# Patient Record
Sex: Male | Born: 1935 | Race: Black or African American | Hispanic: No | Marital: Married
Health system: Southern US, Community
[De-identification: ages and names within clinical notes are randomized; demographics above are authoritative.]

---

## 2021-09-21 ENCOUNTER — Inpatient Hospital Stay
Admission: AD | Admit: 2021-09-21 | Discharge: 2021-11-09 | Disposition: A | Discharge status: Skilled Nursing Facility | Payer: Medicare Other | Source: Other Acute Inpatient Hospital | Attending: Internal Medicine | Admitting: Internal Medicine

## 2021-09-21 ENCOUNTER — Other Ambulatory Visit (HOSPITAL_COMMUNITY): Payer: Medicare Other

## 2021-09-21 DIAGNOSIS — I480 Paroxysmal atrial fibrillation: Secondary | ICD-10-CM

## 2021-09-21 DIAGNOSIS — R140 Abdominal distension (gaseous): Secondary | ICD-10-CM

## 2021-09-21 DIAGNOSIS — R6520 Severe sepsis without septic shock: Secondary | ICD-10-CM

## 2021-09-21 DIAGNOSIS — G9341 Metabolic encephalopathy: Secondary | ICD-10-CM

## 2021-09-21 DIAGNOSIS — S72002A Fracture of unspecified part of neck of left femur, initial encounter for closed fracture: Secondary | ICD-10-CM

## 2021-09-21 DIAGNOSIS — Z9359 Other cystostomy status: Secondary | ICD-10-CM

## 2021-09-21 DIAGNOSIS — J95811 Postprocedural pneumothorax: Secondary | ICD-10-CM

## 2021-09-21 DIAGNOSIS — Z9689 Presence of other specified functional implants: Secondary | ICD-10-CM

## 2021-09-21 DIAGNOSIS — J811 Chronic pulmonary edema: Secondary | ICD-10-CM

## 2021-09-21 DIAGNOSIS — K567 Ileus, unspecified: Secondary | ICD-10-CM

## 2021-09-21 DIAGNOSIS — J90 Pleural effusion, not elsewhere classified: Secondary | ICD-10-CM

## 2021-09-21 DIAGNOSIS — Z9889 Other specified postprocedural states: Secondary | ICD-10-CM

## 2021-09-21 DIAGNOSIS — A419 Sepsis, unspecified organism: Secondary | ICD-10-CM

## 2021-09-21 DIAGNOSIS — J189 Pneumonia, unspecified organism: Secondary | ICD-10-CM

## 2021-09-21 DIAGNOSIS — Z938 Other artificial opening status: Secondary | ICD-10-CM

## 2021-09-21 DIAGNOSIS — J9621 Acute and chronic respiratory failure with hypoxia: Secondary | ICD-10-CM

## 2021-09-21 DIAGNOSIS — Z431 Encounter for attention to gastrostomy: Secondary | ICD-10-CM

## 2021-09-21 DIAGNOSIS — J9690 Respiratory failure, unspecified, unspecified whether with hypoxia or hypercapnia: Secondary | ICD-10-CM

## 2021-09-21 DIAGNOSIS — Z931 Gastrostomy status: Secondary | ICD-10-CM

## 2021-09-21 DIAGNOSIS — Z9911 Dependence on respirator [ventilator] status: Secondary | ICD-10-CM

## 2021-09-21 DIAGNOSIS — J939 Pneumothorax, unspecified: Secondary | ICD-10-CM

## 2021-09-21 LAB — BLOOD GAS, ARTERIAL
Acid-base deficit: 1.1 mmol/L (ref 0.0–2.0)
Bicarbonate: 22 mmol/L (ref 20.0–28.0)
FIO2: 28
O2 Saturation: 95.6 %
Patient temperature: 37
pCO2 arterial: 30 mmHg — ABNORMAL LOW (ref 32.0–48.0)
pH, Arterial: 7.478 — ABNORMAL HIGH (ref 7.350–7.450)
pO2, Arterial: 73.5 mmHg — ABNORMAL LOW (ref 83.0–108.0)

## 2021-09-21 MED ORDER — DIATRIZOATE MEGLUMINE & SODIUM 66-10 % PO SOLN: 300.0000 mL | Freq: Once | ORAL | Status: DC

## 2021-09-21 MED ORDER — DIATRIZOATE MEGLUMINE & SODIUM 66-10 % PO SOLN
ORAL | Status: AC
  Filled 2021-09-21: qty 30

## 2021-09-21 NOTE — Consult Note (Signed)
Referring Physician: S. Manson Passey, MD  Stephen Cobb is an 87 y.o. male.                       Chief Complaint: atrial fibrillation with RVR  HPI: 87 years old white male with PMH of paroxysmal atrial fibrillation, HTN, Septic shock, metabolic encephalopathy, C. Difficile colitis, CKD, IV, Previous CVA, Chronic systolic heart failure, Sacral area decubitus, Bilateral BKA stump ulcers and type 2 DM had episode of atrial fibrillation with RVR self terminating into sinus rhythm. Limited echocardiogram last month showed EF 25-30 % with mild Mitral stenosis.  Past medical history as per HPI.  PSH: Angioplasty-1993 and TURP - 12/2007.  The histories are not reviewed yet. Please review them in the "History" navigator section and refresh this SmartLink.  No family history on file. Social History:  has no history on file for tobacco use, alcohol use, and drug use.  Allergies: No known allergies.  No medications prior to admission.  Amiodarone, Coreg, Eliquis, Januvia, Famotidine, Tylenol, etc.   Results for orders placed or performed during the hospital encounter of 09/21/21 (from the past 48 hour(s))  Blood gas, arterial     Status: Abnormal   Collection Time: 09/21/21  4:44 PM  Result Value Ref Range   FIO2 28.00    pH, Arterial 7.478 (H) 7.350 - 7.450   pCO2 arterial 30.0 (L) 32.0 - 48.0 mmHg   pO2, Arterial 73.5 (L) 83.0 - 108.0 mmHg   Bicarbonate 22.0 20.0 - 28.0 mmol/L   Acid-base deficit 1.1 0.0 - 2.0 mmol/L   O2 Saturation 95.6 %   Patient temperature 37.0    Collection site RIGHT BRACHIAL    Drawn by FB POWERS,RRT    Sample type ARTHROGRAPHIS SPECIES     Comment: Performed at [facility], [address], [place], [place] [address]   No results found.  Review Of Systems As per HPI.   P: 92, Sinus rhythm, R: 30, BP: 156/90, O2 sat 100 % on 28 % FiO2 by T collar There is no height or weight on file to calculate BMI. General appearance: alert, cooperative, appears stated  age and in moderate respiratory distress Head: Normocephalic, atraumatic. Eyes: Blue eyes, pink conjunctiva, corneas clear. PERRL, EOM's intact. Neck: No adenopathy, no carotid bruit, no JVD, supple, symmetrical, tracheostomy. Resp: Clearing to auscultation bilaterally. Cardio: Regular rate and rhythm, S1, S2 normal, II/VI systolic murmur, no click, rub or gallop GI: Soft, non-tender; bowel sounds normal; no organomegaly. Extremities: No edema, cyanosis or clubbing. Bil. BKA with dressing on stumps. Skin: Warm and dry.  Neurologic: Alert and oriented X 0. Contracture of both upper extremities but has some motion of right upper extremity.  Assessment/Plan Atrial fibrillation with RVR, paroxysmal, [id number] score of 7 Acute on chronic respiratory failure with hypoxia S/P tracheostomy HTN Type 2 DM Bilateral BKA S/P CVA S/P septic shock S/P encephalopathy  Plan: Continue amiodarone and Eliquis. Increase coreg to 12.5 mg. Bid. Add small dose diltiazem as tolerated for rate control. Agree with prn metoprolol.   Time spent: Review of old records, Lab, x-rays, EKG, other cardiac tests, examination, discussion with patient/Nurse/Doctor over 70 minutes.  Ricki Rodriguez, MD  09/21/2021, 5:21 PM

## 2021-09-22 DIAGNOSIS — I480 Paroxysmal atrial fibrillation: Secondary | ICD-10-CM

## 2021-09-22 DIAGNOSIS — J9621 Acute and chronic respiratory failure with hypoxia: Secondary | ICD-10-CM

## 2021-09-22 DIAGNOSIS — G9341 Metabolic encephalopathy: Secondary | ICD-10-CM

## 2021-09-22 DIAGNOSIS — A419 Sepsis, unspecified organism: Secondary | ICD-10-CM

## 2021-09-22 DIAGNOSIS — J189 Pneumonia, unspecified organism: Secondary | ICD-10-CM | POA: Diagnosis not present

## 2021-09-22 DIAGNOSIS — R6520 Severe sepsis without septic shock: Secondary | ICD-10-CM

## 2021-09-22 LAB — COMPREHENSIVE METABOLIC PANEL
ALT: 16 U/L (ref 0–44)
AST: 20 U/L (ref 15–41)
Albumin: 1.7 g/dL — ABNORMAL LOW (ref 3.5–5.0)
Alkaline Phosphatase: 60 U/L (ref 38–126)
Anion gap: 8 (ref 5–15)
BUN: 66 mg/dL — ABNORMAL HIGH (ref 8–23)
CO2: 22 mmol/L (ref 22–32)
Calcium: 8.2 mg/dL — ABNORMAL LOW (ref 8.9–10.3)
Chloride: 114 mmol/L — ABNORMAL HIGH (ref 98–111)
Creatinine, Ser: 1.92 mg/dL — ABNORMAL HIGH (ref 0.61–1.24)
GFR, Estimated: 33 mL/min — ABNORMAL LOW (ref >60)
Glucose, Bld: 142 mg/dL — ABNORMAL HIGH (ref 70–99)
Potassium: 3.6 mmol/L (ref 3.5–5.1)
Sodium: 144 mmol/L (ref 135–145)
Total Bilirubin: 0.6 mg/dL (ref 0.3–1.2)
Total Protein: 5.8 g/dL — ABNORMAL LOW (ref 6.5–8.1)

## 2021-09-22 LAB — CBC WITH DIFFERENTIAL/PLATELET
Abs Immature Granulocytes: 0.04 10*3/uL (ref 0.00–0.07)
Basophils Absolute: 0 10*3/uL (ref 0.0–0.1)
Basophils Relative: 0 %
Eosinophils Absolute: 0.3 10*3/uL (ref 0.0–0.5)
Eosinophils Relative: 4 %
HCT: 27.3 % — ABNORMAL LOW (ref 39.0–52.0)
Hemoglobin: 8.3 g/dL — ABNORMAL LOW (ref 13.0–17.0)
Immature Granulocytes: 0 %
Lymphocytes Relative: 14 %
Lymphs Abs: 1.3 10*3/uL (ref 0.7–4.0)
MCH: 27.9 pg (ref 26.0–34.0)
MCHC: 30.4 g/dL (ref 30.0–36.0)
MCV: 91.6 fL (ref 80.0–100.0)
Monocytes Absolute: 0.6 10*3/uL (ref 0.1–1.0)
Monocytes Relative: 6 %
Neutro Abs: 6.8 10*3/uL (ref 1.7–7.7)
Neutrophils Relative %: 76 %
Platelets: 155 10*3/uL (ref 150–400)
RBC: 2.98 MIL/uL — ABNORMAL LOW (ref 4.22–5.81)
RDW: 19 % — ABNORMAL HIGH (ref 11.5–15.5)
WBC: 9 10*3/uL (ref 4.0–10.5)
nRBC: 0.2 % (ref 0.0–0.2)

## 2021-09-22 LAB — URINALYSIS, ROUTINE W REFLEX MICROSCOPIC
Bilirubin Urine: NEGATIVE
Glucose, UA: NEGATIVE mg/dL
Ketones, ur: NEGATIVE mg/dL
Nitrite: POSITIVE — AB
Protein, ur: 100 mg/dL — AB
RBC / HPF: >50 RBC/hpf — ABNORMAL HIGH (ref 0–5)
Specific Gravity, Urine: 1.016 (ref 1.005–1.030)
WBC, UA: >50 WBC/hpf — ABNORMAL HIGH (ref 0–5)
pH: 5 (ref 5.0–8.0)

## 2021-09-22 LAB — PHOSPHORUS: Phosphorus: 3.4 mg/dL (ref 2.5–4.6)

## 2021-09-22 LAB — TSH: TSH: 3.133 u[IU]/mL (ref 0.350–4.500)

## 2021-09-22 LAB — MAGNESIUM: Magnesium: 1.7 mg/dL (ref 1.7–2.4)

## 2021-09-22 LAB — PROTIME-INR
INR: 1.4 — ABNORMAL HIGH (ref 0.8–1.2)
Prothrombin Time: 16.8 seconds — ABNORMAL HIGH (ref 11.4–15.2)

## 2021-09-22 LAB — HEMOGLOBIN A1C
Hgb A1c MFr Bld: 6.9 % — ABNORMAL HIGH (ref 4.8–5.6)
Mean Plasma Glucose: 151.33 mg/dL

## 2021-09-22 LAB — BRAIN NATRIURETIC PEPTIDE: B Natriuretic Peptide: 1446.6 pg/mL — ABNORMAL HIGH (ref 0.0–100.0)

## 2021-09-22 NOTE — Consult Note (Signed)
Pulmonary Critical Care Medicine [facility] [place]  PULMONARY SERVICE  Date of Service: 09/22/2021  PULMONARY CRITICAL CARE CONSULT   Stephen Cobb  NWG:[id number]  DOB: December 21, 1930   DOA: 09/21/2021  Referring Physician: Luna Kitchens, MD  HPI: Stephen Cobb is a 87 y.o. male seen for follow up of Acute on Chronic Respiratory Failure.  Patient has multiple medical problems including stroke atrial fibrillation hypertension type 2 diabetes chronic systolic heart failure stage IV kidney disease came into the hospital with sepsis was found to have urinary tract infection along with decubitus ulcers and C. difficile infection.  Patient had a long protracted course with respiratory failure and ended up intubated on mechanical ventilation subsequently was not able to come off of mechanical ventilation and had to have tracheostomy done.  Patient now is on T collar and is on 28% FiO2  Review of Systems:  ROS performed and is unremarkable other than noted above.  Past Medical History Previous CVA Paroxysmal atrial fibrillation Hypertension Chronic systolic CHF Type 2 diabetes mellitus Stage IV chronic kidney disease PVD/bilateral BKA's  Surgical History He has a past surgical history that includes Angioplasty (1993) and Transurethral resection of prostate (12/2007).   Past family history: Noncontributory to the present illness other than hypertension and leukemia  Allergies: No known drug allergies  Social history former smoker no alcohol or drug abuse  Medications: Reviewed on Rounds  Physical Exam:  Vitals: Temperature is 98.0 pulse 89 respiratory 25 blood pressure is 146/56 saturations 100%  Ventilator Settings off the ventilator on T collar  General: Comfortable at this time Eyes: Grossly normal lids, irises & conjunctiva ENT: grossly tongue is normal Neck: no obvious mass Cardiovascular: S1-S2 normal no gallop or rub Respiratory: Scattered rhonchi  expansion is equal Abdomen: Soft and nontender Skin: no rash seen on limited exam Musculoskeletal: not rigid Psychiatric:unable to assess Neurologic: no seizure no involuntary movements         Labs on Admission:  Basic Metabolic Panel: Recent Labs  Lab 09/22/21 0427  NA 144  K 3.6  CL 114*  CO2 22  GLUCOSE 142*  BUN 66*  CREATININE 1.92*  CALCIUM 8.2*  MG 1.7  PHOS 3.4    Recent Labs  Lab 09/21/21 1644  PHART 7.478*  PCO2ART 30.0*  PO2ART 73.5*  HCO3 22.0  O2SAT 95.6    Liver Function Tests: Recent Labs  Lab 09/22/21 0427  AST 20  ALT 16  ALKPHOS 60  BILITOT 0.6  PROT 5.8*  ALBUMIN 1.7*   No results for input(s): LIPASE, AMYLASE in the last 168 hours. No results for input(s): AMMONIA in the last 168 hours.  CBC: Recent Labs  Lab 09/22/21 0427  WBC 9.0  NEUTROABS 6.8  HGB 8.3*  HCT 27.3*  MCV 91.6  PLT 155    Cardiac Enzymes: No results for input(s): CKTOTAL, CKMB, CKMBINDEX, TROPONINI in the last 168 hours.  BNP (last 3 results) Recent Labs    09/22/21 0427  BNP 1,446.6*    ProBNP (last 3 results) No results for input(s): PROBNP in the last 8760 hours.   Radiological Exams on Admission: DG ABDOMEN PEG TUBE LOCATION  Result Date: 09/21/2021 CLINICAL DATA:  Peg adjustment/replacement/ EXAM: ABDOMEN - 1 VIEW COMPARISON:  None. FINDINGS: Peg tube projects over the stomach. Contrast is seen within the stomach and proximal small bowel. No contrast extravasation. No evidence of bowel obstruction. The pigtail drainage catheter projects over the lower pelvis. Chronic appearing left femoral neck fracture  varus angulation. IMPRESSION: Peg tube within the stomach.  No contrast extravasation. No bowel obstruction. Chronic appearing left femoral neck fracture. Electronically Signed   By: Charlett Nose M.D.   On: 09/21/2021 18:22   DG CHEST Cobb 1 VIEW  Result Date: 09/21/2021 CLINICAL DATA:  Ventilator dependent, respiratory failure, pneumonia  EXAM: PORTABLE CHEST 1 VIEW COMPARISON:  None. FINDINGS: Tracheostomy projects over the mid trachea. Right PICC line tip is at the cavoatrial junction. Diffuse airspace disease throughout the left lung. No confluent opacity on the right. Mild vascular congestion. Suspect small bilateral effusions. No acute bony abnormality. IMPRESSION: Diffuse airspace disease throughout the left lung concerning for pneumonia. Mild vascular congestion. Suspect small effusions. Electronically Signed   By: Charlett Nose M.D.   On: 09/21/2021 18:23    Assessment/Plan Active Problems:   Acute on chronic respiratory failure with hypoxia (HCC)   AF (paroxysmal atrial fibrillation) (HCC)   Severe sepsis (HCC)   Acute metabolic encephalopathy   Healthcare-associated pneumonia   Acute on chronic respiratory failure with hypoxia patient is now off the ventilator on T collar.  Plan is going to be to change the trach out downsized to a #6 cuffless trach today and then assess for PMV.  Secretions might be the limiting factor Paroxysmal atrial fibrillation rate is controlled at this time we will continue with supportive care Severe sepsis treated resolved hemodynamics Reitnauer stable we will continue to monitor closely. Acute metabolic encephalopathy slow to improve we will continue with supportive care Healthcare associated pneumonia patient's chest x-ray does have diffuse airspace disease along with some pulmonary vascular congestion.  Cardiology has already seen the patient will follow-up with x-rays  I have personally seen and evaluated the patient, evaluated laboratory and imaging results, formulated the assessment and plan and placed orders. The Patient requires high complexity decision making with multiple systems involvement.  Case was discussed on Rounds with the Respiratory Therapy Director and the Respiratory staff Time Spent  Yevonne Pax, MD [facility] Pulmonary Critical Care Medicine Sleep Medicine

## 2021-09-22 NOTE — Consult Note (Signed)
Ref: Default, Provider, MD   Subjective:  Awake. He is in sinus rhythm.   Objective:  Vital Signs in the last 24 hours:  P: 85, R: 29, BP: 150/80, O2 sat: 100 %, FiO2 28 %  Physical Exam: BP Readings from Last 1 Encounters:  No data found for BP     Wt Readings from Last 1 Encounters:  No data found for Wt    Weight change:  There is no height or weight on file to calculate BMI. HEENT: [place]/AT, Eyes-Blue, Conjunctiva-Pale, Sclera-Non-icteric Neck: No JVD, No bruit, Tracheostomy in place. Lungs:  Clearing, Bilateral. Cardiac:  Regular rhythm, normal S1 and S2, no S3. II/VI systolic murmur. Abdomen:  Soft, non-tender. BS present. Extremities:  No edema present. No cyanosis. No clubbing. Bilateral BKA. CNS: AxOx3, Cranial nerves grossly intact. Contracture of both upper extremities. Skin: Warm and dry.   Intake/Output from previous day: No intake/output data recorded.    Lab Results: BMET    Component Value Date/Time   NA 144 09/22/2021 0427   K 3.6 09/22/2021 0427   CL 114 (H) 09/22/2021 0427   CO2 22 09/22/2021 0427   GLUCOSE 142 (H) 09/22/2021 0427   BUN 66 (H) 09/22/2021 0427   CREATININE 1.92 (H) 09/22/2021 0427   CALCIUM 8.2 (L) 09/22/2021 0427   GFRNONAA 33 (L) 09/22/2021 0427   CBC    Component Value Date/Time   WBC 9.0 09/22/2021 0427   RBC 2.98 (L) 09/22/2021 0427   HGB 8.3 (L) 09/22/2021 0427   HCT 27.3 (L) 09/22/2021 0427   PLT 155 09/22/2021 0427   MCV 91.6 09/22/2021 0427   MCH 27.9 09/22/2021 0427   MCHC 30.4 09/22/2021 0427   RDW 19.0 (H) 09/22/2021 0427   LYMPHSABS 1.3 09/22/2021 0427   MONOABS 0.6 09/22/2021 0427   EOSABS 0.3 09/22/2021 0427   BASOSABS 0.0 09/22/2021 0427   HEPATIC Function Panel Recent Labs    09/22/21 0427  PROT 5.8*   HEMOGLOBIN A1C No components found for: HGA1C,  MPG CARDIAC ENZYMES No results found for: CKTOTAL, CKMB, CKMBINDEX, TROPONINI BNP No results for input(s): PROBNP in the last 8760  hours. TSH Recent Labs    09/22/21 0427  TSH 3.133   CHOLESTEROL No results for input(s): CHOL in the last 8760 hours.  Scheduled Meds:  diatrizoate meglumine-sodium  300 mL Per Tube Once   Continuous Infusions:  PRN Meds:.  Assessment/Plan:  Paroxysmal atrial fibrillation Acute on chronic respiratory failure with hypoxia S/P tracheostomy HTN Type 2 DM Bilateral BKA S/P CVA PVD S/P septic shock S/P encephalopathy  Plan: Continue medical treatment   LOS: 0 days   Time spent including chart review, lab review, examination, discussion with patient/Nurse : 30 min   Orpah Cobb  MD  09/22/2021, 12:32 PM

## 2021-09-23 DIAGNOSIS — J9621 Acute and chronic respiratory failure with hypoxia: Secondary | ICD-10-CM | POA: Diagnosis not present

## 2021-09-23 DIAGNOSIS — J189 Pneumonia, unspecified organism: Secondary | ICD-10-CM | POA: Diagnosis not present

## 2021-09-23 DIAGNOSIS — G9341 Metabolic encephalopathy: Secondary | ICD-10-CM | POA: Diagnosis not present

## 2021-09-23 DIAGNOSIS — I480 Paroxysmal atrial fibrillation: Secondary | ICD-10-CM | POA: Diagnosis not present

## 2021-09-23 LAB — RENAL FUNCTION PANEL
Albumin: 1.8 g/dL — ABNORMAL LOW (ref 3.5–5.0)
Anion gap: 10 (ref 5–15)
BUN: 70 mg/dL — ABNORMAL HIGH (ref 8–23)
CO2: 21 mmol/L — ABNORMAL LOW (ref 22–32)
Calcium: 8.2 mg/dL — ABNORMAL LOW (ref 8.9–10.3)
Chloride: 112 mmol/L — ABNORMAL HIGH (ref 98–111)
Creatinine, Ser: 1.94 mg/dL — ABNORMAL HIGH (ref 0.61–1.24)
GFR, Estimated: 33 mL/min — ABNORMAL LOW (ref >60)
Glucose, Bld: 157 mg/dL — ABNORMAL HIGH (ref 70–99)
Phosphorus: 3.5 mg/dL (ref 2.5–4.6)
Potassium: 3.8 mmol/L (ref 3.5–5.1)
Sodium: 143 mmol/L (ref 135–145)

## 2021-09-23 LAB — CBC
HCT: 24.8 % — ABNORMAL LOW (ref 39.0–52.0)
Hemoglobin: 7.6 g/dL — ABNORMAL LOW (ref 13.0–17.0)
MCH: 27.7 pg (ref 26.0–34.0)
MCHC: 30.6 g/dL (ref 30.0–36.0)
MCV: 90.5 fL (ref 80.0–100.0)
Platelets: 174 10*3/uL (ref 150–400)
RBC: 2.74 MIL/uL — ABNORMAL LOW (ref 4.22–5.81)
RDW: 18.8 % — ABNORMAL HIGH (ref 11.5–15.5)
WBC: 10.4 10*3/uL (ref 4.0–10.5)
nRBC: 0 % (ref 0.0–0.2)

## 2021-09-23 LAB — URINE CULTURE: Culture: >=100000 — AB

## 2021-09-23 LAB — MAGNESIUM: Magnesium: 1.8 mg/dL (ref 1.7–2.4)

## 2021-09-23 NOTE — Progress Notes (Signed)
Pulmonary Critical Care Medicine [facility] [place]   PULMONARY CRITICAL CARE SERVICE  PROGRESS NOTE     LUQMAN PERRELLI  ALP:[id number]  DOB: [birth year]   DOA: 09/21/2021  Referring Physician: Luna Kitchens, MD  HPI: DREWEY BEGUE is a 87 y.o. male being followed for ventilator/airway/oxygen weaning Acute on Chronic Respiratory Failure.  Patient is on T collar weaning on 28% FiO2 should be able to try PMV today  Medications: Reviewed on Rounds  Physical Exam:  Vitals: Temperature is 97.8 pulse 78 respiratory 24 blood pressure is 120/100 saturations 98%  Ventilator Settings currently off the ventilator on T collar  General: Comfortable at this time Neck: supple Cardiovascular: no malignant arrhythmias Respiratory: No rhonchi very coarse breath sounds Skin: no rash seen on limited exam Musculoskeletal: No gross abnormality Psychiatric:unable to assess Neurologic:no involuntary movements         Lab Data:   Basic Metabolic Panel: Recent Labs  Lab 09/22/21 0427 09/23/21 0358  NA 144 143  K 3.6 3.8  CL 114* 112*  CO2 22 21*  GLUCOSE 142* 157*  BUN 66* 70*  CREATININE 1.92* 1.94*  CALCIUM 8.2* 8.2*  MG 1.7 1.8  PHOS 3.4 3.5    ABG: Recent Labs  Lab 09/21/21 1644  PHART 7.478*  PCO2ART 30.0*  PO2ART 73.5*  HCO3 22.0  O2SAT 95.6    Liver Function Tests: Recent Labs  Lab 09/22/21 0427 09/23/21 0358  AST 20  --   ALT 16  --   ALKPHOS 60  --   BILITOT 0.6  --   PROT 5.8*  --   ALBUMIN 1.7* 1.8*   No results for input(s): LIPASE, AMYLASE in the last 168 hours. No results for input(s): AMMONIA in the last 168 hours.  CBC: Recent Labs  Lab 09/22/21 0427 09/23/21 0358  WBC 9.0 10.4  NEUTROABS 6.8  --   HGB 8.3* 7.6*  HCT 27.3* 24.8*  MCV 91.6 90.5  PLT 155 174    Cardiac Enzymes: No results for input(s): CKTOTAL, CKMB, CKMBINDEX, TROPONINI in the last 168 hours.  BNP (last 3 results) Recent Labs    09/22/21 0427   BNP 1,446.6*    ProBNP (last 3 results) No results for input(s): PROBNP in the last 8760 hours.  Radiological Exams: DG ABDOMEN PEG TUBE LOCATION  Result Date: 09/21/2021 CLINICAL DATA:  Peg adjustment/replacement/ EXAM: ABDOMEN - 1 VIEW COMPARISON:  None. FINDINGS: Peg tube projects over the stomach. Contrast is seen within the stomach and proximal small bowel. No contrast extravasation. No evidence of bowel obstruction. The pigtail drainage catheter projects over the lower pelvis. Chronic appearing left femoral neck fracture varus angulation. IMPRESSION: Peg tube within the stomach.  No contrast extravasation. No bowel obstruction. Chronic appearing left femoral neck fracture. Electronically Signed   By: Charlett Nose M.D.   On: 09/21/2021 18:22   DG CHEST PORT 1 VIEW  Result Date: 09/21/2021 CLINICAL DATA:  Ventilator dependent, respiratory failure, pneumonia EXAM: PORTABLE CHEST 1 VIEW COMPARISON:  None. FINDINGS: Tracheostomy projects over the mid trachea. Right PICC line tip is at the cavoatrial junction. Diffuse airspace disease throughout the left lung. No confluent opacity on the right. Mild vascular congestion. Suspect small bilateral effusions. No acute bony abnormality. IMPRESSION: Diffuse airspace disease throughout the left lung concerning for pneumonia. Mild vascular congestion. Suspect small effusions. Electronically Signed   By: Charlett Nose M.D.   On: 09/21/2021 18:23    Assessment/Plan Active Problems:   Acute on chronic  respiratory failure with hypoxia (HCC)   AF (paroxysmal atrial fibrillation) (HCC)   Severe sepsis (HCC)   Acute metabolic encephalopathy   Healthcare-associated pneumonia   Acute on chronic respiratory failure with hypoxia we will continue with T collar patient is on 28% FiO2 use PMV as ordered Atrial fibrillation rate is controlled Severe sepsis treated resolved Metabolic encephalopathy slow to improve Of care associated pneumonia treated   I  have personally seen and evaluated the patient, evaluated laboratory and imaging results, formulated the assessment and plan and placed orders. The Patient requires high complexity decision making with multiple systems involvement.  Rounds were done with the Respiratory Therapy Director and Staff therapists and discussed with nursing staff also.  Yevonne Pax, MD [facility] Pulmonary Critical Care Medicine Sleep Medicine

## 2021-09-23 NOTE — Consult Note (Addendum)
Ref: Shayne Alken, MD   Subjective:  Awake. Monitor shows sinus rhythm.  Objective:  Vital Signs in the last 24 hours:  P: 71, R: 18, BP: 120/90, O2 sat 100 % on T collar  Physical Exam: BP Readings from Last 1 Encounters:  No data found for BP     Wt Readings from Last 1 Encounters:  No data found for Wt    Weight change:  There is no height or weight on file to calculate BMI. HEENT: [place]/AT, Eyes-Blue, Conjunctiva-Pale, Sclera-Non-icteric Neck: No JVD, No bruit, Tracheostomy in place. Lungs:  Clearing, Bilateral. Cardiac:  Regular rhythm, normal S1 and S2, no S3. II/VI systolic murmur. Abdomen:  Soft, non-tender. BS present. Extremities:  No edema present. No cyanosis. No clubbing. Bil. BKA. CNS: AxOx1., Cranial nerves grossly intact. Bilateral upper extremities contractures.  Skin: Warm and dry.   Intake/Output from previous day: No intake/output data recorded.    Lab Results: BMET    Component Value Date/Time   NA 143 09/23/2021 0358   NA 144 09/22/2021 0427   K 3.8 09/23/2021 0358   K 3.6 09/22/2021 0427   CL 112 (H) 09/23/2021 0358   CL 114 (H) 09/22/2021 0427   CO2 21 (L) 09/23/2021 0358   CO2 22 09/22/2021 0427   GLUCOSE 157 (H) 09/23/2021 0358   GLUCOSE 142 (H) 09/22/2021 0427   BUN 70 (H) 09/23/2021 0358   BUN 66 (H) 09/22/2021 0427   CREATININE 1.94 (H) 09/23/2021 0358   CREATININE 1.92 (H) 09/22/2021 0427   CALCIUM 8.2 (L) 09/23/2021 0358   CALCIUM 8.2 (L) 09/22/2021 0427   GFRNONAA 33 (L) 09/23/2021 0358   GFRNONAA 33 (L) 09/22/2021 0427   CBC    Component Value Date/Time   WBC 10.4 09/23/2021 0358   RBC 2.74 (L) 09/23/2021 0358   HGB 7.6 (L) 09/23/2021 0358   HCT 24.8 (L) 09/23/2021 0358   PLT 174 09/23/2021 0358   MCV 90.5 09/23/2021 0358   MCH 27.7 09/23/2021 0358   MCHC 30.6 09/23/2021 0358   RDW 18.8 (H) 09/23/2021 0358   LYMPHSABS 1.3 09/22/2021 0427   MONOABS 0.6 09/22/2021 0427   EOSABS 0.3 09/22/2021 0427   BASOSABS  0.0 09/22/2021 0427   HEPATIC Function Panel Recent Labs    09/22/21 0427  PROT 5.8*   HEMOGLOBIN A1C No components found for: HGA1C,  MPG CARDIAC ENZYMES No results found for: CKTOTAL, CKMB, CKMBINDEX, TROPONINI BNP No results for input(s): PROBNP in the last 8760 hours. TSH Recent Labs    09/22/21 0427  TSH 3.133   CHOLESTEROL No results for input(s): CHOL in the last 8760 hours.  Scheduled Meds:  diatrizoate meglumine-sodium  300 mL Per Tube Once   Continuous Infusions:  PRN Meds:.  Assessment/Plan:  Paroxysmal atrial fibrillation Acute on chronic respiratory failure with hypoxia S/P tracheostomy HTN Type 2 DM Bilateral BKA S/P CVA S/P septic shock S/P encephalopathy  Plan: Continue medical treatment.   LOS: 0 days   Time spent including chart review, lab review, examination, discussion with patient/Nurse : 25 min   Orpah Cobb  MD  09/23/2021, 1:00 PM

## 2021-09-24 ENCOUNTER — Other Ambulatory Visit (HOSPITAL_COMMUNITY): Payer: Medicare Other

## 2021-09-24 LAB — CBC
HCT: 27.2 % — ABNORMAL LOW (ref 39.0–52.0)
Hemoglobin: 8.3 g/dL — ABNORMAL LOW (ref 13.0–17.0)
MCH: 28.1 pg (ref 26.0–34.0)
MCHC: 30.5 g/dL (ref 30.0–36.0)
MCV: 92.2 fL (ref 80.0–100.0)
Platelets: 182 10*3/uL (ref 150–400)
RBC: 2.95 MIL/uL — ABNORMAL LOW (ref 4.22–5.81)
RDW: 18.6 % — ABNORMAL HIGH (ref 11.5–15.5)
WBC: 9.2 10*3/uL (ref 4.0–10.5)
nRBC: 0 % (ref 0.0–0.2)

## 2021-09-24 LAB — RENAL FUNCTION PANEL
Albumin: 1.9 g/dL — ABNORMAL LOW (ref 3.5–5.0)
Anion gap: 8 (ref 5–15)
BUN: 73 mg/dL — ABNORMAL HIGH (ref 8–23)
CO2: 23 mmol/L (ref 22–32)
Calcium: 8.2 mg/dL — ABNORMAL LOW (ref 8.9–10.3)
Chloride: 112 mmol/L — ABNORMAL HIGH (ref 98–111)
Creatinine, Ser: 1.97 mg/dL — ABNORMAL HIGH (ref 0.61–1.24)
GFR, Estimated: 32 mL/min — ABNORMAL LOW (ref >60)
Glucose, Bld: 174 mg/dL — ABNORMAL HIGH (ref 70–99)
Phosphorus: 3.3 mg/dL (ref 2.5–4.6)
Potassium: 4.1 mmol/L (ref 3.5–5.1)
Sodium: 143 mmol/L (ref 135–145)

## 2021-09-24 LAB — CULTURE, RESPIRATORY W GRAM STAIN

## 2021-09-24 LAB — MAGNESIUM: Magnesium: 1.9 mg/dL (ref 1.7–2.4)

## 2021-09-25 DIAGNOSIS — S72002A Fracture of unspecified part of neck of left femur, initial encounter for closed fracture: Secondary | ICD-10-CM | POA: Insufficient documentation

## 2021-09-25 DIAGNOSIS — Z9911 Dependence on respirator [ventilator] status: Secondary | ICD-10-CM | POA: Diagnosis not present

## 2021-09-25 DIAGNOSIS — S72002S Fracture of unspecified part of neck of left femur, sequela: Secondary | ICD-10-CM

## 2021-09-25 NOTE — Consult Note (Signed)
ORTHOPAEDIC CONSULTATION  REQUESTING PHYSICIAN: Carron Curie, MD  Chief Complaint: Chronic left femoral neck fracture.  HPI: Stephen Cobb is a 87 y.o. male who presents with multiple medical problems ventilator dependent with a chronic left femoral neck fracture.  No past medical history on file.  Social History   Socioeconomic History   Marital status: Married    Spouse name: Not on file   Number of children: Not on file   Years of education: Not on file   Highest education level: Not on file  Occupational History   Not on file  Tobacco Use   Smoking status: Not on file   Smokeless tobacco: Not on file  Substance and Sexual Activity   Alcohol use: Not on file   Drug use: Not on file   Sexual activity: Not on file  Other Topics Concern   Not on file  Social History Narrative   Not on file   Social Determinants of Health   Financial Resource Strain: Not on file  Food Insecurity: Not on file  Transportation Needs: Not on file  Physical Activity: Not on file  Stress: Not on file  Social Connections: Not on file   No family history on file. - negative except otherwise stated in the family history section Not on File Prior to Admission medications   Not on File   DG Chest Port 1 View  Result Date: 09/24/2021 CLINICAL DATA:  87 year old male with history of pneumonia. EXAM: PORTABLE CHEST 1 VIEW COMPARISON:  Chest x-ray 09/21/2021. FINDINGS: A tracheostomy tube is in place with tip 7.3 cm above the carina. There is a right upper extremity PICC with tip terminating in the superior cavoatrial junction. Opacity in the medial aspect of the left lung base may reflect atelectasis and/or consolidation. Widespread areas of interstitial prominence are also noted throughout the mid to lower lungs bilaterally. Small bilateral pleural effusions. No pneumothorax. No evidence of pulmonary edema. Heart size is normal. The patient is rotated to the right on today's exam, resulting  in distortion of the mediastinal contours and reduced diagnostic sensitivity and specificity for mediastinal pathology. Atherosclerotic calcifications in the thoracic aorta. IMPRESSION: 1. Support apparatus, as above. 2. Atelectasis and/or consolidation in the left lower lobe with small bilateral pleural effusions. Patchy areas of interstitial prominence throughout the mid to lower lungs bilaterally may reflect additional areas of bronchopneumonia. Electronically Signed   By: Trudie Reed M.D.   On: 09/24/2021 05:45   - pertinent xrays, CT, MRI studies were reviewed and independently interpreted  Positive ROS: All other systems have been reviewed and were otherwise negative with the exception of those mentioned in the HPI and as above.  Physical Exam: General: Alert, no acute distress Psychiatric: Patient is competent for consent with normal mood and affect Lymphatic: No axillary or cervical lymphadenopathy Cardiovascular: No pedal edema Respiratory: No cyanosis, no use of accessory musculature GI: No organomegaly, abdomen is soft and non-tender    Images:  @ENCIMAGES @  Labs:  Lab Results  Component Value Date   HGBA1C 6.9 (H) 09/22/2021   REPTSTATUS 09/24/2021 FINAL 09/21/2021   GRAMSTAIN  09/21/2021    RARE SQUAMOUS EPITHELIAL CELLS PRESENT ABUNDANT WBC PRESENT,BOTH PMN AND MONONUCLEAR FEW GRAM NEGATIVE RODS MODERATE GRAM POSITIVE RODS Performed at [facility], [address], [place], [place]    CULT  09/21/2021    ABUNDANT DIPHTHEROIDS(CORYNEBACTERIUM SPECIES) Standardized susceptibility testing for this organism is not available. RARE STENOTROPHOMONAS MALTOPHILIA  LABORGA STENOTROPHOMONAS MALTOPHILIA 09/21/2021    Lab Results  Component Value Date   ALBUMIN 1.9 (L) 09/24/2021   ALBUMIN 1.8 (L) 09/23/2021   ALBUMIN 1.7 (L) 09/22/2021     CBC EXTENDED Latest Ref Rng & Units 09/24/2021 09/23/2021 09/22/2021  WBC 4.0 - 10.5 K/uL 9.2 10.4 9.0   RBC 4.22 - 5.81 MIL/uL 2.95(L) 2.74(L) 2.98(L)  HGB 13.0 - 17.0 g/dL 8.3(L) 7.6(L) 8.3(L)  HCT 39.0 - 52.0 % 27.2(L) 24.8(L) 27.3(L)  PLT 150 - 400 K/uL 182 174 155  NEUTROABS 1.7 - 7.7 K/uL - - 6.8  LYMPHSABS 0.7 - 4.0 K/uL - - 1.3    Neurologic: Patient does not have protective sensation bilateral lower extremities.   MUSCULOSKELETAL:   Skin: Examination patient has swelling around the left hip but there is no cellulitis no ulcers.  Review of the radiographs shows a chronic femoral neck fracture with avascular necrosis of the femoral head and superior migration of the proximal femur.  Patient's albumin is 1.9.  Hemoglobin A1c 6.9.  Hemoglobin 8.3.  Assessment: Assessment: Chronic femoral neck fracture with superior migration of the proximal femur.  Plan: Plan: Patient may ambulate on the left lower extremity weightbearing as tolerated no restrictions.  No indications for surgery at this time.  Thank you for the consult and the opportunity to see Mr. Stephen Daluz, MD [facility] [phone] 11:10 AM

## 2021-09-25 NOTE — Consult Note (Addendum)
Ref: Shayne Alken, MD   Subjective:  Awake. Normal sinus rhythm. Respiratory distress continues.  Objective:  Vital Signs in the last 24 hours:  P: 71, R: 16, O2 sat 100 % on 28 % FiO2.  Physical Exam: BP Readings from Last 1 Encounters:  No data found for BP     Wt Readings from Last 1 Encounters:  No data found for Wt    Weight change:  There is no height or weight on file to calculate BMI. HEENT: [place]/AT, Eyes-Blue Conjunctiva-Pale, Sclera-Non-icteric Neck: No JVD, No bruit, Tracheostomy in place. Lungs:  Clearing, Bilateral. Cardiac:  Regular rhythm, normal S1 and S2, no S3. II/VI systolic murmur. Abdomen:  Soft, non-tender. BS present. Extremities:  No edema present. No cyanosis. No clubbing. Bilateral BKA. CNS: AxOx0.   Skin: Warm and dry.   Intake/Output from previous day: No intake/output data recorded.    Lab Results: BMET    Component Value Date/Time   NA 143 09/24/2021 0246   NA 143 09/23/2021 0358   NA 144 09/22/2021 0427   K 4.1 09/24/2021 0246   K 3.8 09/23/2021 0358   K 3.6 09/22/2021 0427   CL 112 (H) 09/24/2021 0246   CL 112 (H) 09/23/2021 0358   CL 114 (H) 09/22/2021 0427   CO2 23 09/24/2021 0246   CO2 21 (L) 09/23/2021 0358   CO2 22 09/22/2021 0427   GLUCOSE 174 (H) 09/24/2021 0246   GLUCOSE 157 (H) 09/23/2021 0358   GLUCOSE 142 (H) 09/22/2021 0427   BUN 73 (H) 09/24/2021 0246   BUN 70 (H) 09/23/2021 0358   BUN 66 (H) 09/22/2021 0427   CREATININE 1.97 (H) 09/24/2021 0246   CREATININE 1.94 (H) 09/23/2021 0358   CREATININE 1.92 (H) 09/22/2021 0427   CALCIUM 8.2 (L) 09/24/2021 0246   CALCIUM 8.2 (L) 09/23/2021 0358   CALCIUM 8.2 (L) 09/22/2021 0427   GFRNONAA 32 (L) 09/24/2021 0246   GFRNONAA 33 (L) 09/23/2021 0358   GFRNONAA 33 (L) 09/22/2021 0427   CBC    Component Value Date/Time   WBC 9.2 09/24/2021 0246   RBC 2.95 (L) 09/24/2021 0246   HGB 8.3 (L) 09/24/2021 0246   HCT 27.2 (L) 09/24/2021 0246   PLT 182 09/24/2021  0246   MCV 92.2 09/24/2021 0246   MCH 28.1 09/24/2021 0246   MCHC 30.5 09/24/2021 0246   RDW 18.6 (H) 09/24/2021 0246   LYMPHSABS 1.3 09/22/2021 0427   MONOABS 0.6 09/22/2021 0427   EOSABS 0.3 09/22/2021 0427   BASOSABS 0.0 09/22/2021 0427   HEPATIC Function Panel Recent Labs    09/22/21 0427  PROT 5.8*   HEMOGLOBIN A1C No components found for: HGA1C,  MPG CARDIAC ENZYMES No results found for: CKTOTAL, CKMB, CKMBINDEX, TROPONINI BNP No results for input(s): PROBNP in the last 8760 hours. TSH Recent Labs    09/22/21 0427  TSH 3.133   CHOLESTEROL No results for input(s): CHOL in the last 8760 hours.  Scheduled Meds:  diatrizoate meglumine-sodium  300 mL Per Tube Once   Continuous Infusions:  PRN Meds:.  Assessment/Plan:  Paroxysmal atrial fibrillation Acute on chronic respiratory failure with hypoxia S/P Tracheostomy HTN Type 2 DM Bilateral BKA PVD S/P Stroke S/P septic shock S/P pneumonia S/P encephalopathy Severe hypoalbuminemia CKD IV Anemia of CKD  Moderate protein calorie malnutrition  Plan: Continue medical treatment.   LOS: 0 days   Time spent including chart review, lab review, examination, discussion with patient/Nurse : 30 min   Avrom Robarts Algie Coffer  MD  09/25/2021, 11:01 AM

## 2021-09-26 ENCOUNTER — Other Ambulatory Visit (HOSPITAL_COMMUNITY): Payer: Medicare Other

## 2021-09-26 LAB — CBC
HCT: 26.2 % — ABNORMAL LOW (ref 39.0–52.0)
Hemoglobin: 7.9 g/dL — ABNORMAL LOW (ref 13.0–17.0)
MCH: 27.6 pg (ref 26.0–34.0)
MCHC: 30.2 g/dL (ref 30.0–36.0)
MCV: 91.6 fL (ref 80.0–100.0)
Platelets: 223 10*3/uL (ref 150–400)
RBC: 2.86 MIL/uL — ABNORMAL LOW (ref 4.22–5.81)
RDW: 18.3 % — ABNORMAL HIGH (ref 11.5–15.5)
WBC: 9.8 10*3/uL (ref 4.0–10.5)
nRBC: 0 % (ref 0.0–0.2)

## 2021-09-26 LAB — RENAL FUNCTION PANEL
Albumin: 1.9 g/dL — ABNORMAL LOW (ref 3.5–5.0)
Anion gap: 8 (ref 5–15)
BUN: 83 mg/dL — ABNORMAL HIGH (ref 8–23)
CO2: 24 mmol/L (ref 22–32)
Calcium: 8.7 mg/dL — ABNORMAL LOW (ref 8.9–10.3)
Chloride: 112 mmol/L — ABNORMAL HIGH (ref 98–111)
Creatinine, Ser: 1.86 mg/dL — ABNORMAL HIGH (ref 0.61–1.24)
GFR, Estimated: 34 mL/min — ABNORMAL LOW (ref >60)
Glucose, Bld: 148 mg/dL — ABNORMAL HIGH (ref 70–99)
Phosphorus: 3.1 mg/dL (ref 2.5–4.6)
Potassium: 3.8 mmol/L (ref 3.5–5.1)
Sodium: 144 mmol/L (ref 135–145)

## 2021-09-26 LAB — MAGNESIUM: Magnesium: 1.9 mg/dL (ref 1.7–2.4)

## 2021-09-27 ENCOUNTER — Other Ambulatory Visit (HOSPITAL_COMMUNITY): Payer: Medicare Other

## 2021-09-27 LAB — RENAL FUNCTION PANEL
Albumin: 1.8 g/dL — ABNORMAL LOW (ref 3.5–5.0)
Anion gap: 6 (ref 5–15)
BUN: 84 mg/dL — ABNORMAL HIGH (ref 8–23)
CO2: 23 mmol/L (ref 22–32)
Calcium: 8.6 mg/dL — ABNORMAL LOW (ref 8.9–10.3)
Chloride: 111 mmol/L (ref 98–111)
Creatinine, Ser: 1.85 mg/dL — ABNORMAL HIGH (ref 0.61–1.24)
GFR, Estimated: 35 mL/min — ABNORMAL LOW (ref >60)
Glucose, Bld: 81 mg/dL (ref 70–99)
Phosphorus: 3.1 mg/dL (ref 2.5–4.6)
Potassium: 3.3 mmol/L — ABNORMAL LOW (ref 3.5–5.1)
Sodium: 140 mmol/L (ref 135–145)

## 2021-09-27 LAB — PREPARE RBC (CROSSMATCH)

## 2021-09-27 LAB — CBC
HCT: 23 % — ABNORMAL LOW (ref 39.0–52.0)
Hemoglobin: 7 g/dL — ABNORMAL LOW (ref 13.0–17.0)
MCH: 28 pg (ref 26.0–34.0)
MCHC: 30.4 g/dL (ref 30.0–36.0)
MCV: 92 fL (ref 80.0–100.0)
Platelets: 198 10*3/uL (ref 150–400)
RBC: 2.5 MIL/uL — ABNORMAL LOW (ref 4.22–5.81)
RDW: 18.1 % — ABNORMAL HIGH (ref 11.5–15.5)
WBC: 8.2 10*3/uL (ref 4.0–10.5)
nRBC: 0 % (ref 0.0–0.2)

## 2021-09-27 LAB — OCCULT BLOOD X 1 CARD TO LAB, STOOL: Fecal Occult Bld: NEGATIVE

## 2021-09-27 LAB — MAGNESIUM: Magnesium: 1.9 mg/dL (ref 1.7–2.4)

## 2021-09-27 LAB — ABO/RH: ABO/RH(D): O POS

## 2021-09-28 DIAGNOSIS — J189 Pneumonia, unspecified organism: Secondary | ICD-10-CM | POA: Diagnosis not present

## 2021-09-28 DIAGNOSIS — J9621 Acute and chronic respiratory failure with hypoxia: Secondary | ICD-10-CM | POA: Diagnosis not present

## 2021-09-28 DIAGNOSIS — I480 Paroxysmal atrial fibrillation: Secondary | ICD-10-CM | POA: Diagnosis not present

## 2021-09-28 DIAGNOSIS — G9341 Metabolic encephalopathy: Secondary | ICD-10-CM | POA: Diagnosis not present

## 2021-09-28 LAB — TYPE AND SCREEN
ABO/RH(D): O POS
Antibody Screen: NEGATIVE
Unit division: 0

## 2021-09-28 LAB — BPAM RBC
Blood Product Expiration Date: 202211052359
ISSUE DATE / TIME: 202210311545
Unit Type and Rh: 5100

## 2021-09-28 LAB — CBC
HCT: 26.5 % — ABNORMAL LOW (ref 39.0–52.0)
Hemoglobin: 8.2 g/dL — ABNORMAL LOW (ref 13.0–17.0)
MCH: 27.5 pg (ref 26.0–34.0)
MCHC: 30.9 g/dL (ref 30.0–36.0)
MCV: 88.9 fL (ref 80.0–100.0)
Platelets: 193 10*3/uL (ref 150–400)
RBC: 2.98 MIL/uL — ABNORMAL LOW (ref 4.22–5.81)
RDW: 17.4 % — ABNORMAL HIGH (ref 11.5–15.5)
WBC: 7.1 10*3/uL (ref 4.0–10.5)
nRBC: 0 % (ref 0.0–0.2)

## 2021-09-28 LAB — POTASSIUM: Potassium: 3.3 mmol/L — ABNORMAL LOW (ref 3.5–5.1)

## 2021-09-28 NOTE — Progress Notes (Signed)
Pulmonary Critical Care Medicine [facility] [place]   PULMONARY CRITICAL CARE SERVICE  PROGRESS NOTE     Stephen Cobb  NTI:[id number]  DOB: [birth year]   DOA: 09/21/2021  Referring Physician: Luna Kitchens, MD  HPI: Stephen Cobb is a 87 y.o. male being followed for ventilator/airway/oxygen weaning Acute on Chronic Respiratory Failure.  Comfortable right now without distress on T collar supposed be doing PMV  Medications: Reviewed on Rounds  Physical Exam:  Vitals: Temperature is 96.9 pulse 75 respiratory 19 blood pressure is 123/44 saturations 100%  Ventilator Settings off the ventilator on T collar FiO2 is 28%  General: Comfortable at this time Neck: supple Cardiovascular: no malignant arrhythmias Respiratory: Scattered rhonchi very coarse breath sounds Skin: no rash seen on limited exam Musculoskeletal: No gross abnormality Psychiatric:unable to assess Neurologic:no involuntary movements         Lab Data:   Basic Metabolic Panel: Recent Labs  Lab 09/22/21 0427 09/23/21 0358 09/24/21 0246 09/26/21 0650 09/27/21 0359 09/28/21 0448  NA 144 143 143 144 140  --   K 3.6 3.8 4.1 3.8 3.3* 3.3*  CL 114* 112* 112* 112* 111  --   CO2 22 21* 23 24 23   --   GLUCOSE 142* 157* 174* 148* 81  --   BUN 66* 70* 73* 83* 84*  --   CREATININE 1.92* 1.94* 1.97* 1.86* 1.85*  --   CALCIUM 8.2* 8.2* 8.2* 8.7* 8.6*  --   MG 1.7 1.8 1.9 1.9 1.9  --   PHOS 3.4 3.5 3.3 3.1 3.1  --     ABG: Recent Labs  Lab 09/21/21 1644  PHART 7.478*  PCO2ART 30.0*  PO2ART 73.5*  HCO3 22.0  O2SAT 95.6    Liver Function Tests: Recent Labs  Lab 09/22/21 0427 09/23/21 0358 09/24/21 0246 09/26/21 0650 09/27/21 0359  AST 20  --   --   --   --   ALT 16  --   --   --   --   ALKPHOS 60  --   --   --   --   BILITOT 0.6  --   --   --   --   PROT 5.8*  --   --   --   --   ALBUMIN 1.7* 1.8* 1.9* 1.9* 1.8*   No results for input(s): LIPASE, AMYLASE in the last 168  hours. No results for input(s): AMMONIA in the last 168 hours.  CBC: Recent Labs  Lab 09/22/21 0427 09/23/21 0358 09/24/21 0246 09/26/21 0650 09/27/21 0359  WBC 9.0 10.4 9.2 9.8 8.2  NEUTROABS 6.8  --   --   --   --   HGB 8.3* 7.6* 8.3* 7.9* 7.0*  HCT 27.3* 24.8* 27.2* 26.2* 23.0*  MCV 91.6 90.5 92.2 91.6 92.0  PLT 155 174 182 223 198    Cardiac Enzymes: No results for input(s): CKTOTAL, CKMB, CKMBINDEX, TROPONINI in the last 168 hours.  BNP (last 3 results) Recent Labs    09/22/21 0427  BNP 1,446.6*    ProBNP (last 3 results) No results for input(s): PROBNP in the last 8760 hours.  Radiological Exams: DG Abd Portable 1V  Result Date: 09/27/2021 CLINICAL DATA:  87 year old male with history of ileus. EXAM: PORTABLE ABDOMEN - 1 VIEW COMPARISON:  09/26/2021. FINDINGS: Small bore drainage catheter with tip formed over the low anatomic pelvis. Several nondilated gas-filled loops of small bowel are noted in the central abdomen. Gas and stool are noted throughout  the colon. Paucity of distal rectal gas. No definitive pneumoperitoneum noted on this single supine image. IMPRESSION: 1. Nonspecific, nonobstructive bowel gas pattern, as above. Electronically Signed   By: Trudie Reed M.D.   On: 09/27/2021 06:36    Assessment/Plan Active Problems:   Acute on chronic respiratory failure with hypoxia (HCC)   AF (paroxysmal atrial fibrillation) (HCC)   Severe sepsis (HCC)   Acute metabolic encephalopathy   Healthcare-associated pneumonia   Ventilator dependent (HCC)   Closed fracture of neck of left femur (HCC)   Acute on chronic respiratory failure with hypoxia we will continue with the T collar.  Patient currently is on 28% FiO2 plan is going to be to advance weaning on PMV as tolerated Atrial fibrillation) rate is controlled Severe sepsis treated resolved hemodynamics are stable Metabolic encephalopathy no change we will continue to follow Healthcare associated pneumonia  treated slow improvement Ventilator dependent weaning gradually   I have personally seen and evaluated the patient, evaluated laboratory and imaging results, formulated the assessment and plan and placed orders. The Patient requires high complexity decision making with multiple systems involvement.  Rounds were done with the Respiratory Therapy Director and Staff therapists and discussed with nursing staff also.  Yevonne Pax, MD [facility] Pulmonary Critical Care Medicine Sleep Medicine

## 2021-09-29 ENCOUNTER — Other Ambulatory Visit (HOSPITAL_COMMUNITY): Payer: Medicare Other

## 2021-09-29 DIAGNOSIS — J9621 Acute and chronic respiratory failure with hypoxia: Secondary | ICD-10-CM | POA: Diagnosis not present

## 2021-09-29 DIAGNOSIS — I480 Paroxysmal atrial fibrillation: Secondary | ICD-10-CM | POA: Diagnosis not present

## 2021-09-29 DIAGNOSIS — G9341 Metabolic encephalopathy: Secondary | ICD-10-CM | POA: Diagnosis not present

## 2021-09-29 DIAGNOSIS — J189 Pneumonia, unspecified organism: Secondary | ICD-10-CM | POA: Diagnosis not present

## 2021-09-29 LAB — BASIC METABOLIC PANEL
Anion gap: 8 (ref 5–15)
BUN: 67 mg/dL — ABNORMAL HIGH (ref 8–23)
CO2: 21 mmol/L — ABNORMAL LOW (ref 22–32)
Calcium: 8.4 mg/dL — ABNORMAL LOW (ref 8.9–10.3)
Chloride: 112 mmol/L — ABNORMAL HIGH (ref 98–111)
Creatinine, Ser: 1.87 mg/dL — ABNORMAL HIGH (ref 0.61–1.24)
GFR, Estimated: 34 mL/min — ABNORMAL LOW (ref >60)
Glucose, Bld: 96 mg/dL (ref 70–99)
Potassium: 3.6 mmol/L (ref 3.5–5.1)
Sodium: 141 mmol/L (ref 135–145)

## 2021-09-29 NOTE — Progress Notes (Signed)
Pulmonary Critical Care Medicine [facility] [place]   PULMONARY CRITICAL CARE SERVICE  PROGRESS NOTE     Stephen Cobb  FTD:[id number]  DOB: [birth year]   DOA: 09/21/2021  Referring Physician: Luna Kitchens, MD  HPI: Stephen Cobb is a 87 y.o. male being followed for ventilator/airway/oxygen weaning Acute on Chronic Respiratory Failure.  Afebrile right now on T collar 28% FiO2 supposed be using PMV  Medications: Reviewed on Rounds  Physical Exam:  Vitals: Temperature is 98.2 pulse 72 respiratory 22 blood pressure is 128/50 saturations 97%  Ventilator Settings off the ventilator on T collar  General: Comfortable at this time Neck: supple Cardiovascular: no malignant arrhythmias Respiratory: Scattered rhonchi expansion is equal Skin: no rash seen on limited exam Musculoskeletal: No gross abnormality Psychiatric:unable to assess Neurologic:no involuntary movements         Lab Data:   Basic Metabolic Panel: Recent Labs  Lab 09/23/21 0358 09/24/21 0246 09/26/21 0650 09/27/21 0359 09/28/21 0448  NA 143 143 144 140  --   K 3.8 4.1 3.8 3.3* 3.3*  CL 112* 112* 112* 111  --   CO2 21* 23 24 23   --   GLUCOSE 157* 174* 148* 81  --   BUN 70* 73* 83* 84*  --   CREATININE 1.94* 1.97* 1.86* 1.85*  --   CALCIUM 8.2* 8.2* 8.7* 8.6*  --   MG 1.8 1.9 1.9 1.9  --   PHOS 3.5 3.3 3.1 3.1  --     ABG: No results for input(s): PHART, PCO2ART, PO2ART, HCO3, O2SAT in the last 168 hours.  Liver Function Tests: Recent Labs  Lab 09/23/21 0358 09/24/21 0246 09/26/21 0650 09/27/21 0359  ALBUMIN 1.8* 1.9* 1.9* 1.8*   No results for input(s): LIPASE, AMYLASE in the last 168 hours. No results for input(s): AMMONIA in the last 168 hours.  CBC: Recent Labs  Lab 09/23/21 0358 09/24/21 0246 09/26/21 0650 09/27/21 0359 09/28/21 0913  WBC 10.4 9.2 9.8 8.2 7.1  HGB 7.6* 8.3* 7.9* 7.0* 8.2*  HCT 24.8* 27.2* 26.2* 23.0* 26.5*  MCV 90.5 92.2 91.6 92.0 88.9  PLT  174 182 223 198 193    Cardiac Enzymes: No results for input(s): CKTOTAL, CKMB, CKMBINDEX, TROPONINI in the last 168 hours.  BNP (last 3 results) Recent Labs    09/22/21 0427  BNP 1,446.6*    ProBNP (last 3 results) No results for input(s): PROBNP in the last 8760 hours.  Radiological Exams: DG Abd Portable 1V  Result Date: 09/29/2021 CLINICAL DATA:  87 year old male with history of ileus. EXAM: PORTABLE ABDOMEN - 1 VIEW COMPARISON:  09/27/2021. FINDINGS: Several nondilated loops of gas-filled small bowel are noted in the central abdomen. Gas and stool is noted throughout the colon and distal rectum. No pathologic dilatation of small bowel or colon. No gross evidence of pneumoperitoneum on this single supine image. Small bore pigtail drainage catheter again projects over the low anatomic pelvis. IMPRESSION: 1. Nonspecific, nonobstructive bowel gas pattern, as above. Electronically Signed   By: 09/29/2021 M.D.   On: 09/29/2021 05:32    Assessment/Plan Active Problems:   Acute on chronic respiratory failure with hypoxia (HCC)   AF (paroxysmal atrial fibrillation) (HCC)   Severe sepsis (HCC)   Acute metabolic encephalopathy   Healthcare-associated pneumonia   Ventilator dependent (HCC)   Closed fracture of neck of left femur (HCC)   Acute on chronic respiratory failure hypoxia we will continue with weaning on T-piece patient is on 28% FiO2  with good saturations.  Continue secretion management pulmonary toilet Atrial fibrillation right now rate is controlled we will continue to monitor along closely Severe sepsis treated resolved Metabolic encephalopathy slow to improve Healthcare associated pneumonia treated again slowly improving we will continue to follow along   I have personally seen and evaluated the patient, evaluated laboratory and imaging results, formulated the assessment and plan and placed orders. The Patient requires high complexity decision making with multiple  systems involvement.  Rounds were done with the Respiratory Therapy Director and Staff therapists and discussed with nursing staff also.  Yevonne Pax, MD [facility] Pulmonary Critical Care Medicine Sleep Medicine

## 2021-09-30 DIAGNOSIS — I480 Paroxysmal atrial fibrillation: Secondary | ICD-10-CM | POA: Diagnosis not present

## 2021-09-30 DIAGNOSIS — J9621 Acute and chronic respiratory failure with hypoxia: Secondary | ICD-10-CM | POA: Diagnosis not present

## 2021-09-30 DIAGNOSIS — G9341 Metabolic encephalopathy: Secondary | ICD-10-CM | POA: Diagnosis not present

## 2021-09-30 DIAGNOSIS — J189 Pneumonia, unspecified organism: Secondary | ICD-10-CM | POA: Diagnosis not present

## 2021-09-30 NOTE — Progress Notes (Signed)
Pulmonary Critical Care Medicine [facility] [place]   PULMONARY CRITICAL CARE SERVICE  PROGRESS NOTE     Stephen Cobb  JEH:[id number]  DOB: [birth year]   DOA: 09/21/2021  Referring Physician: Luna Kitchens, MD  HPI: Stephen Cobb is a 87 y.o. male being followed for ventilator/airway/oxygen weaning Acute on Chronic Respiratory Failure.  Patient is on T collar room air has been noting increased heart rate.  Suggested follow-up with cardiology patient does have history of atrial fibrillation  Medications: Reviewed on Rounds  Physical Exam:  Vitals: Temperature is 97.5 pulse 96 respiratory rate is 30 blood pressure is one 5/86 saturations 98%  Ventilator Settings off ventilator on T collar  General: Comfortable at this time Neck: supple Cardiovascular: no malignant arrhythmias Respiratory: No rhonchi very coarse breath sounds Skin: no rash seen on limited exam Musculoskeletal: No gross abnormality Psychiatric:unable to assess Neurologic:no involuntary movements         Lab Data:   Basic Metabolic Panel: Recent Labs  Lab 09/24/21 0246 09/26/21 0650 09/27/21 0359 09/28/21 0448 09/29/21 1315  NA 143 144 140  --  141  K 4.1 3.8 3.3* 3.3* 3.6  CL 112* 112* 111  --  112*  CO2 23 24 23   --  21*  GLUCOSE 174* 148* 81  --  96  BUN 73* 83* 84*  --  67*  CREATININE 1.97* 1.86* 1.85*  --  1.87*  CALCIUM 8.2* 8.7* 8.6*  --  8.4*  MG 1.9 1.9 1.9  --   --   PHOS 3.3 3.1 3.1  --   --     ABG: No results for input(s): PHART, PCO2ART, PO2ART, HCO3, O2SAT in the last 168 hours.  Liver Function Tests: Recent Labs  Lab 09/24/21 0246 09/26/21 0650 09/27/21 0359  ALBUMIN 1.9* 1.9* 1.8*   No results for input(s): LIPASE, AMYLASE in the last 168 hours. No results for input(s): AMMONIA in the last 168 hours.  CBC: Recent Labs  Lab 09/24/21 0246 09/26/21 0650 09/27/21 0359 09/28/21 0913  WBC 9.2 9.8 8.2 7.1  HGB 8.3* 7.9* 7.0* 8.2*  HCT 27.2* 26.2*  23.0* 26.5*  MCV 92.2 91.6 92.0 88.9  PLT 182 223 198 193    Cardiac Enzymes: No results for input(s): CKTOTAL, CKMB, CKMBINDEX, TROPONINI in the last 168 hours.  BNP (last 3 results) Recent Labs    09/22/21 0427  BNP 1,446.6*    ProBNP (last 3 results) No results for input(s): PROBNP in the last 8760 hours.  Radiological Exams: DG Abd Portable 1V  Result Date: 09/29/2021 CLINICAL DATA:  87 year old male with history of ileus. EXAM: PORTABLE ABDOMEN - 1 VIEW COMPARISON:  09/27/2021. FINDINGS: Several nondilated loops of gas-filled small bowel are noted in the central abdomen. Gas and stool is noted throughout the colon and distal rectum. No pathologic dilatation of small bowel or colon. No gross evidence of pneumoperitoneum on this single supine image. Small bore pigtail drainage catheter again projects over the low anatomic pelvis. IMPRESSION: 1. Nonspecific, nonobstructive bowel gas pattern, as above. Electronically Signed   By: 09/29/2021 M.D.   On: 09/29/2021 05:32    Assessment/Plan Active Problems:   Acute on chronic respiratory failure with hypoxia (HCC)   AF (paroxysmal atrial fibrillation) (HCC)   Severe sepsis (HCC)   Acute metabolic encephalopathy   Healthcare-associated pneumonia   Ventilator dependent (HCC)   Closed fracture of neck of left femur (HCC)   Acute on chronic respiratory failure hypoxia we will  continue with the T-piece as ordered.  Patient's heart rate limiting [place] from being able to advance we will have cardiology follow-up Atrial fibrillation rate 130 right now cardiology adjusting medications Metabolic encephalopathy no change we will continue to follow along closely Healthcare associated pneumonia treated Vent dependent patient's been liberated from the ventilator   I have personally seen and evaluated the patient, evaluated laboratory and imaging results, formulated the assessment and plan and placed orders. The Patient requires high  complexity decision making with multiple systems involvement.  Rounds were done with the Respiratory Therapy Director and Staff therapists and discussed with nursing staff also.  Yevonne Pax, MD [facility] Pulmonary Critical Care Medicine Sleep Medicine

## 2021-09-30 NOTE — Progress Notes (Signed)
Called to bring pt down for GT replacement. Per nurse no transport monitor available to bring patient down.

## 2021-10-01 ENCOUNTER — Other Ambulatory Visit (HOSPITAL_COMMUNITY): Payer: Medicare Other

## 2021-10-01 DIAGNOSIS — J9621 Acute and chronic respiratory failure with hypoxia: Secondary | ICD-10-CM | POA: Diagnosis not present

## 2021-10-01 DIAGNOSIS — G9341 Metabolic encephalopathy: Secondary | ICD-10-CM | POA: Diagnosis not present

## 2021-10-01 DIAGNOSIS — J189 Pneumonia, unspecified organism: Secondary | ICD-10-CM | POA: Diagnosis not present

## 2021-10-01 DIAGNOSIS — I480 Paroxysmal atrial fibrillation: Secondary | ICD-10-CM | POA: Diagnosis not present

## 2021-10-01 HISTORY — PX: IR REPLC GASTRO/COLONIC TUBE PERCUT W/FLUORO: IMG2333

## 2021-10-01 MED ORDER — LIDOCAINE VISCOUS HCL 2 % MT SOLN
OROMUCOSAL | Status: AC
  Filled 2021-10-01: qty 15

## 2021-10-01 NOTE — Procedures (Signed)
Interventional Radiology Procedure Note  Procedure: Replacement of a displaced percutaneous gastrostomy.  New 90F balloon retention. Complications: None Recommendations: - OK to use - Routine care    Signed,   Yvone Neu. Loreta Ave, DO

## 2021-10-01 NOTE — Consult Note (Signed)
Infectious Disease Consultation   Stephen Cobb  [id number]  DOB: 05-05-1931  DOA: 09/21/2021  Requesting physician: Dr. Owens Shark  Reason for consultation: Antibiotic recommendations  History of Present Illness: Stephen Cobb is an 87 y.o. male with history of previous CVA, paroxysmal atrial fibrillation on Coumadin, hypertension, diabetes mellitus type 2, chronic systolic congestive heart failure, stage IV chronic kidney disease, chronic sacral decubitus ulcer, peripheral vascular disease with bilateral BKA's who presented to the acute facility due to worsening confusion, altered mental status, tachycardia.  He was also found to be hypotensive with elevated WBC count with elevated lactic acid.  He was started on empiric IV vancomycin, Zosyn.  However, he had persistent hypotension and had to be started on pressors at the acute facility.  He was found to be in sepsis with shock secondary to UTI, pressure ulcers, C. difficile infection.  He was started on oral vancomycin with a 6-week taper with tentative end date 09/30/2021.  Eventually once improved he was able to be weaned off pressors.  He received treatment with antibiotics for UTI and pressure ulcers.  He had ventilator dependent respiratory failure status post trach and PEG on 09/07/2021. On 09/15/2021 he was found to have purulent penile discharge, he had suprapubic catheter placement.  Due to his complex medical problems he was transferred and admitted to [facility].  After admission here he had tracheal aspirate cultures collected on 09/21/2021 which per report showing moderate gram-positive rods, abundant diphtheroids, rare Stenotrophomonas maltophilia.  He had urine cultures collected on 09/21/2021 which are showing 100,000 colonies per mL of Klebsiella pneumonia which is ESBL.  Review of Systems:  ROS As per HPI otherwise review of systems negative.   Past Medical History: Previous CVA Paroxysmal atrial  fibrillation Hypertension Chronic systolic CHF Type 2 diabetes mellitus Stage IV chronic kidney disease PVD/bilateral BKA's  Past Surgical History: He has a past surgical history that includes Angioplasty (1993) and Transurethral resection of prostate (12/2007).  Trach and PEG tube placement on 09/07/2021.  Allergies: No known drug allergies  Social History: Previous smoker, no alcohol or recreational drug abuse  Family History: Hypertension in his mother; Leukemia in his father.  Physical Exam: Vitals:   10/01/21 0900  BP: (!) 149/86  Resp: (!) 22  SpO2: 99%    Constitutional: Ill-appearing male, awake but confused Head: Atraumatic, normocephalic Eyes: PERLA, EOMI, irises appear normal, anicteric sclera,  ENMT: external ears and nose appear normal, normal hearing, moist oral mucosa   Neck: Has trach in place CVS: S1-S2   Respiratory: Coarse breath sounds, scattered rhonchi Abdomen: soft nontender, nondistended, normal bowel sounds Musculoskeletal: Bilateral BKA with contractures Neuro: Confused, encephalopathic, limited neuro exam Skin: no rashes, multiple pressure injuries as mentioned above.  Please see wound care note for complete details.  Data reviewed:  I have personally reviewed following labs and imaging studies Labs:  CBC: Recent Labs  Lab 09/26/21 0650 09/27/21 0359 09/28/21 0913  WBC 9.8 8.2 7.1  HGB 7.9* 7.0* 8.2*  HCT 26.2* 23.0* 26.5*  MCV 91.6 92.0 88.9  PLT 223 198 0000000    Basic Metabolic Panel: Recent Labs  Lab 09/26/21 0650 09/27/21 0359 09/28/21 0448 09/29/21 1315  NA 144 140  --  141  K 3.8 3.3* 3.3* 3.6  CL 112* 111  --  112*  CO2 24 23  --  21*  GLUCOSE 148* 81  --  96  BUN 83* 84*  --  67*  CREATININE 1.86* 1.85*  --  1.87*  CALCIUM 8.7* 8.6*  --  8.4*  MG 1.9 1.9  --   --   PHOS 3.1 3.1  --   --    GFR CrCl cannot be calculated (Unknown ideal weight.). Liver Function Tests: Recent Labs  Lab 09/26/21 0650  09/27/21 0359  ALBUMIN 1.9* 1.8*   No results for input(s): LIPASE, AMYLASE in the last 168 hours. No results for input(s): AMMONIA in the last 168 hours. Coagulation profile No results for input(s): INR, PROTIME in the last 168 hours.  Cardiac Enzymes: No results for input(s): CKTOTAL, CKMB, CKMBINDEX, TROPONINI in the last 168 hours. BNP: Invalid input(s): POCBNP CBG: No results for input(s): GLUCAP in the last 168 hours. D-Dimer No results for input(s): DDIMER in the last 72 hours. Hgb A1c No results for input(s): HGBA1C in the last 72 hours. Lipid Profile No results for input(s): CHOL, HDL, LDLCALC, TRIG, CHOLHDL, LDLDIRECT in the last 72 hours. Thyroid function studies No results for input(s): TSH, T4TOTAL, T3FREE, THYROIDAB in the last 72 hours.  Invalid input(s): FREET3 Anemia work up No results for input(s): VITAMINB12, FOLATE, FERRITIN, TIBC, IRON, RETICCTPCT in the last 72 hours. Urinalysis    Component Value Date/Time   COLORURINE YELLOW 09/21/2021 1630   APPEARANCEUR CLOUDY (A) 09/21/2021 1630   LABSPEC 1.016 09/21/2021 1630   PHURINE 5.0 09/21/2021 1630   GLUCOSEU NEGATIVE 09/21/2021 1630   HGBUR MODERATE (A) 09/21/2021 1630   BILIRUBINUR NEGATIVE 09/21/2021 1630   KETONESUR NEGATIVE 09/21/2021 1630   PROTEINUR 100 (A) 09/21/2021 1630   NITRITE POSITIVE (A) 09/21/2021 1630   LEUKOCYTESUR LARGE (A) 09/21/2021 1630   Sepsis Labs Invalid input(s): PROCALCITONIN,  WBC,  LACTICIDVEN Microbiology Recent Results (from the past 240 hour(s))  Urine Culture     Status: Abnormal   Collection Time: 09/21/21  4:30 PM   Specimen: Urine, Catheterized  Result Value Ref Range Status   Specimen Description URINE, CATHETERIZED  Final   Special Requests   Final    NONE Performed at [facility], [address], [place], [place] [address]    Culture (A)  Final    >=100,000 COLONIES/mL KLEBSIELLA PNEUMONIAE Confirmed Extended Spectrum Beta-Lactamase Producer  (ESBL).  In bloodstream infections from ESBL organisms, carbapenems are preferred over piperacillin/tazobactam. They are shown to have a lower risk of mortality.    Report Status 09/23/2021 FINAL  Final   Organism ID, Bacteria KLEBSIELLA PNEUMONIAE (A)  Final      Susceptibility   Klebsiella pneumoniae - MIC*    AMPICILLIN >=32 RESISTANT Resistant     CEFAZOLIN >=64 RESISTANT Resistant     CEFEPIME >=32 RESISTANT Resistant     CEFTRIAXONE >=64 RESISTANT Resistant     CIPROFLOXACIN 0.5 INTERMEDIATE Intermediate     GENTAMICIN <=1 SENSITIVE Sensitive     IMIPENEM <=0.25 SENSITIVE Sensitive     NITROFURANTOIN 64 INTERMEDIATE Intermediate     TRIMETH/SULFA >=320 RESISTANT Resistant     AMPICILLIN/SULBACTAM >=32 RESISTANT Resistant     PIP/TAZO <=4 SENSITIVE Sensitive     * >=100,000 COLONIES/mL KLEBSIELLA PNEUMONIAE  Culture, Respiratory w Gram Stain     Status: None   Collection Time: 09/21/21  4:40 PM   Specimen: Tracheal Aspirate  Result Value Ref Range Status   Specimen Description TRACHEAL ASPIRATE  Final   Special Requests NONE  Final   Gram Stain   Final    RARE SQUAMOUS EPITHELIAL CELLS PRESENT ABUNDANT WBC PRESENT,BOTH PMN AND MONONUCLEAR FEW GRAM NEGATIVE RODS MODERATE GRAM  POSITIVE RODS Performed at [facility], [place] [address] [place], [place] [address]    Culture   Final    ABUNDANT DIPHTHEROIDS(CORYNEBACTERIUM SPECIES) Standardized susceptibility testing for this organism is not available. RARE STENOTROPHOMONAS MALTOPHILIA    Report Status 09/24/2021 FINAL  Final   Organism ID, Bacteria STENOTROPHOMONAS MALTOPHILIA  Final      Susceptibility   Stenotrophomonas maltophilia - MIC*    LEVOFLOXACIN 1 SENSITIVE Sensitive     TRIMETH/SULFA <=20 SENSITIVE Sensitive     * RARE STENOTROPHOMONAS MALTOPHILIA   Inpatient Medications:   Scheduled Meds: Please see MAR  Radiological Exams on Admission: IR GASTROSTOMY TUBE REMOVAL/REPAIR  Result Date:  10/01/2021 INDICATION: 87 year old male referred for replacement of a displaced percutaneous gastrostomy. The original placement was at a separate facility. Currently a Foley catheter is maintaining the tract. EXAM: IMAGE GUIDED REPLACEMENT OF MALFUNCTIONING GASTROSTOMY MEDICATIONS: NONE ANESTHESIA/SEDATION: NONE CONTRAST:  10 cc administered into the gastric lumen. FLUOROSCOPY TIME:  Fluoroscopy Time: 0 minutes 12 seconds (2.2 mGy). COMPLICATIONS: None PROCEDURE: Informed written consent was obtained from the patient and the patient's family after a thorough discussion of the procedural risks, benefits and alternatives. All questions were addressed. Maximal Sterile Barrier Technique was utilized including caps, mask, sterile gowns, sterile gloves, sterile drape, hand hygiene and skin antiseptic. A timeout was performed prior to the initiation of the procedure. The Foley catheter was removed and a new 73 French balloon retention gastrostomy was placed. 8 cc of saline was used for inflation. Contrast was injected. The rubber bumper was advanced to the skin surface, with gentle back traction on the gastrostomy for a seal. Final image was stored. . Patient tolerated the procedure well and remained hemodynamically stable throughout. No complications were encountered and no significant blood loss encountered. IMPRESSION: Routine exchange of a previously displaced percutaneous gastrostomy with a new 63 French balloon retention gastrostomy. Electronically Signed   By: Corrie Mckusick D.O.   On: 10/01/2021 10:00    Impression/Recommendations Active Problems:   Acute on chronic respiratory failure with hypoxia (HCC)     Severe sepsis with septic shock, currently shock resolved UTI with Klebsiella pneumonia, ESBL Healthcare-associated pneumonia with stenotrophomonas   Ventilator dependent ([place]) C. difficile infection Sacrococcygeal pressure ulcer stage IV present on admission Left posterior knee pressure injury stage  IV Right ischial pressure injury unstageable Acute on chronic renal failure stage IV AF (paroxysmal atrial fibrillation) (HCC) Systolic congestive heart failure with EF 25%   Encephalopathy    Closed fracture of neck of left femur  Severe peripheral vascular disease with bilateral lower extremity amputation Dysphagia/protein calorie malnutrition  Acute on chronic respiratory failure with hypoxemia: He presented with sepsis and shock to the acute facility.  Subsequently intubated.  Currently undergoing ATC.  Pulmonary following.  His respiratory cultures showed diphtheroids and Stenotrophomonas maltophilia.  He is currently on treatment with meropenem, Levaquin.  The meropenem is for the UTI.  The Levaquin is for the stenotrophomonas.  We will plan to treat for tentative duration of 7 days of the Levaquin pending improvement.  However, he also has dysphagia and high risk for ongoing aspiration and recurrent aspiration pneumonia despite being on antibiotics.  If his respite status worsens recommend to repeat respiratory cultures and chest imaging preferably chest CT without contrast due to concern for acute on chronic renal failure.  Please monitor CBC, BUN/creatinine closely while on antibiotics.  Severe sepsis with septic shock: Patient was on pressors at the acute facility.  The sepsis  was secondary to UTI, C. difficile infection.  Currently shock resolved.  However, respiratory cultures from here showing stenotrophomonas, diphtheroids and urine cultures are showing greater than 100,000 colonies per mL of Klebsiella pneumonia ESBL.  Antibiotics as mentioned above.  If he starts having any worsening fevers or worsening leukocytosis recommend to send for repeat pancultures and imaging.  Please monitor CBC, BUN/creatinine closely while on antibiotics.  UTI: Urine cultures are showing greater than 100,000 colonies per mL of Klebsiella pneumonia, ESBL.  He has a suprapubic catheter and high risk for  recurrent UTI.  Currently on meropenem.  We will plan to treat for duration of 7-10 days pending improvement.  However, he is high risk for recurrent sepsis.  Pneumonia: Patient had respiratory cultures that showed stenotrophomonas.  Antibiotics and plan as mentioned above.  However, as mentioned above he has dysphagia and high risk for aspiration and worsening aspiration pneumonia despite being on antibiotics.  If his respite status worsens recommend to send for repeat respiratory cultures and repeat chest imaging preferably chest CT which can be done without contrast due to concern for renal compromise.  C. difficile infection: Patient is on 6-week taper of p.o. vancomycin.  He is currently on p.o. vancomycin every 72 hours.  However, now started on meropenem and Levaquin therefore high risk for recurrent C. difficile colitis.  Therefore recommend to change the p.o. vancomycin to daily dosing until antibiotics and and after that continue the taper.  If he starts having worsening fevers, worsening leukocytosis would recommend to check CT of the chest/abdomen and pelvis to evaluate for recurrent and worsening pneumonia as well as worsening colitis.  Sacrococcygeal pressure ulcer stage IV present on admission/Left posterior knee pressure injury stage IV/ Right ischial pressure injury unstageable: Continue local wound care.  Due to his debility and decreased mobility he is high risk for worsening of the pressure injuries.  Already on antibiotics as mentioned above.  If any worsening consider consulting surgery to evaluate for probable debridement.  Acute on chronic renal failure stage IV: Continue to monitor BUN/creatinine.  Further management per the primary team.  Avoid nephrotoxic medications.  AF (paroxysmal atrial fibrillation): Continue medications and management per the primary team.  Systolic congestive heart failure with EF 25%: Management per primary team.  Cardiology consulted by the primary  team.   Encephalopathy: Likely toxic/metabolic.  On antibiotics as mentioned above.  Continue supportive management per primary team.  Closed fracture of neck of left femur: Ortho consulted by the primary team.  Further management per primary team.  Severe peripheral vascular disease with bilateral BKA: He has contractures with skin breakdown.  Continue local wound care and supportive management per primary team.  Dysphagia/protein calorie malnutrition: Due to his dysphagia he is high risk for recurrent aspiration and aspiration pneumonia despite being on antibiotics.  Management of PCM per the primary team. Unfortunately due to his complex medical problems, age-related debility he is very high risk for worsening and decompensation. Plan of care discussed with the primary team.  Thank you for this consultation.   Vonzella Nipple M.D. 10/01/2021, 2:34 PM

## 2021-10-01 NOTE — Progress Notes (Signed)
Report received from [place], RN on [employer]. Monitored and on 02 6L via trach collar. See VS

## 2021-10-01 NOTE — Progress Notes (Signed)
Pulmonary Critical Care Medicine [facility] [place]   PULMONARY CRITICAL CARE SERVICE  PROGRESS NOTE     Stephen Cobb  [id number]  DOB: [birth year]   DOA: 09/21/2021  Referring Physician: Luna Kitchens, MD  HPI: Stephen Cobb is a 87 y.o. male being followed for ventilator/airway/oxygen weaning Acute on Chronic Respiratory Failure.  Patient is on room air on T collar currently has been using the PMV  Medications: Reviewed on Rounds  Physical Exam:  Vitals: Temperature is 97.5 pulse 27 blood pressure is 131/82 saturations 100%  Ventilator Settings currently off the ventilator on T collar using PMV  General: Comfortable at this time Neck: supple Cardiovascular: no malignant arrhythmias Respiratory: No rhonchi no rales are noted Skin: no rash seen on limited exam Musculoskeletal: No gross abnormality Psychiatric:unable to assess Neurologic:no involuntary movements         Lab Data:   Basic Metabolic Panel: Recent Labs  Lab 09/26/21 0650 09/27/21 0359 09/28/21 0448 09/29/21 1315  NA 144 140  --  141  K 3.8 3.3* 3.3* 3.6  CL 112* 111  --  112*  CO2 24 23  --  21*  GLUCOSE 148* 81  --  96  BUN 83* 84*  --  67*  CREATININE 1.86* 1.85*  --  1.87*  CALCIUM 8.7* 8.6*  --  8.4*  MG 1.9 1.9  --   --   PHOS 3.1 3.1  --   --     ABG: No results for input(s): PHART, PCO2ART, PO2ART, HCO3, O2SAT in the last 168 hours.  Liver Function Tests: Recent Labs  Lab 09/26/21 0650 09/27/21 0359  ALBUMIN 1.9* 1.8*   No results for input(s): LIPASE, AMYLASE in the last 168 hours. No results for input(s): AMMONIA in the last 168 hours.  CBC: Recent Labs  Lab 09/26/21 0650 09/27/21 0359 09/28/21 0913  WBC 9.8 8.2 7.1  HGB 7.9* 7.0* 8.2*  HCT 26.2* 23.0* 26.5*  MCV 91.6 92.0 88.9  PLT 223 198 193    Cardiac Enzymes: No results for input(s): CKTOTAL, CKMB, CKMBINDEX, TROPONINI in the last 168 hours.  BNP (last 3 results) Recent Labs     09/22/21 0427  BNP 1,446.6*    ProBNP (last 3 results) No results for input(s): PROBNP in the last 8760 hours.  Radiological Exams: No results found.  Assessment/Plan Active Problems:   Acute on chronic respiratory failure with hypoxia (HCC)   AF (paroxysmal atrial fibrillation) (HCC)   Severe sepsis (HCC)   Acute metabolic encephalopathy   Healthcare-associated pneumonia   Ventilator dependent (HCC)   Closed fracture of neck of left femur (HCC)   Acute on chronic respiratory failure hypoxia patient has been on the T collar weaning using PMV heart rate is improving and the plan is going to be to try to start capping if patient is able to keep the heart rate low Atrial fibrillation rate is better controlled today Severe sepsis resolved hemodynamics are stable Metabolic encephalopathy no change Healthcare associated pneumonia treated slowly improved   I have personally seen and evaluated the patient, evaluated laboratory and imaging results, formulated the assessment and plan and placed orders. The Patient requires high complexity decision making with multiple systems involvement.  Rounds were done with the Respiratory Therapy Director and Staff therapists and discussed with nursing staff also.  Yevonne Pax, MD [facility] Pulmonary Critical Care Medicine Sleep Medicine

## 2021-10-02 DIAGNOSIS — I480 Paroxysmal atrial fibrillation: Secondary | ICD-10-CM | POA: Diagnosis not present

## 2021-10-02 DIAGNOSIS — J9621 Acute and chronic respiratory failure with hypoxia: Secondary | ICD-10-CM | POA: Diagnosis not present

## 2021-10-02 DIAGNOSIS — G9341 Metabolic encephalopathy: Secondary | ICD-10-CM | POA: Diagnosis not present

## 2021-10-02 DIAGNOSIS — J189 Pneumonia, unspecified organism: Secondary | ICD-10-CM | POA: Diagnosis not present

## 2021-10-02 LAB — CBC
HCT: 30 % — ABNORMAL LOW (ref 39.0–52.0)
Hemoglobin: 9.3 g/dL — ABNORMAL LOW (ref 13.0–17.0)
MCH: 27.3 pg (ref 26.0–34.0)
MCHC: 31 g/dL (ref 30.0–36.0)
MCV: 88 fL (ref 80.0–100.0)
Platelets: 225 10*3/uL (ref 150–400)
RBC: 3.41 MIL/uL — ABNORMAL LOW (ref 4.22–5.81)
RDW: 17.2 % — ABNORMAL HIGH (ref 11.5–15.5)
WBC: 9 10*3/uL (ref 4.0–10.5)
nRBC: 0 % (ref 0.0–0.2)

## 2021-10-02 LAB — RENAL FUNCTION PANEL
Albumin: 1.6 g/dL — ABNORMAL LOW (ref 3.5–5.0)
Anion gap: 7 (ref 5–15)
BUN: 58 mg/dL — ABNORMAL HIGH (ref 8–23)
CO2: 20 mmol/L — ABNORMAL LOW (ref 22–32)
Calcium: 7.8 mg/dL — ABNORMAL LOW (ref 8.9–10.3)
Chloride: 113 mmol/L — ABNORMAL HIGH (ref 98–111)
Creatinine, Ser: 1.94 mg/dL — ABNORMAL HIGH (ref 0.61–1.24)
GFR, Estimated: 33 mL/min — ABNORMAL LOW (ref >60)
Glucose, Bld: 95 mg/dL (ref 70–99)
Phosphorus: 3.2 mg/dL (ref 2.5–4.6)
Potassium: 4.1 mmol/L (ref 3.5–5.1)
Sodium: 140 mmol/L (ref 135–145)

## 2021-10-02 LAB — MAGNESIUM: Magnesium: 1.6 mg/dL — ABNORMAL LOW (ref 1.7–2.4)

## 2021-10-02 NOTE — Progress Notes (Signed)
Pulmonary Critical Care Medicine [facility] [place]   PULMONARY CRITICAL CARE SERVICE  PROGRESS NOTE     Stephen Cobb  [id number]:[id number]  DOB: Nov 12, 1931   DOA: 09/21/2021  Referring Physician: Luna Kitchens, MD  HPI: Stephen Cobb is a 87 y.o. male being followed for ventilator/airway/oxygen weaning Acute on Chronic Respiratory Failure.  Patient is afebrile right now resting comfortably without distress  Medications: Reviewed on Rounds  Physical Exam:  Vitals: Temperature is 97.3 pulse 62 respiratory rate is 22 blood pressure is 117/63 saturations 100%  Ventilator Settings on capping trials 1 L of oxygen  General: Comfortable at this time Neck: supple Cardiovascular: no malignant arrhythmias Respiratory: No rhonchi very coarse Skin: no rash seen on limited exam Musculoskeletal: No gross abnormality Psychiatric:unable to assess Neurologic:no involuntary movements         Lab Data:   Basic Metabolic Panel: Recent Labs  Lab 09/26/21 0650 09/27/21 0359 09/28/21 0448 09/29/21 1315 10/02/21 0544  NA 144 140  --  141 140  K 3.8 3.3* 3.3* 3.6 4.1  CL 112* 111  --  112* 113*  CO2 24 23  --  21* 20*  GLUCOSE 148* 81  --  96 95  BUN 83* 84*  --  67* 58*  CREATININE 1.86* 1.85*  --  1.87* 1.94*  CALCIUM 8.7* 8.6*  --  8.4* 7.8*  MG 1.9 1.9  --   --  1.6*  PHOS 3.1 3.1  --   --  3.2    ABG: No results for input(s): PHART, PCO2ART, PO2ART, HCO3, O2SAT in the last 168 hours.  Liver Function Tests: Recent Labs  Lab 09/26/21 0650 09/27/21 0359 10/02/21 0544  ALBUMIN 1.9* 1.8* 1.6*   No results for input(s): LIPASE, AMYLASE in the last 168 hours. No results for input(s): AMMONIA in the last 168 hours.  CBC: Recent Labs  Lab 09/26/21 0650 09/27/21 0359 09/28/21 0913 10/02/21 0544  WBC 9.8 8.2 7.1 9.0  HGB 7.9* 7.0* 8.2* 9.3*  HCT 26.2* 23.0* 26.5* 30.0*  MCV 91.6 92.0 88.9 88.0  PLT 223 198 193 225    Cardiac Enzymes: No results for  input(s): CKTOTAL, CKMB, CKMBINDEX, TROPONINI in the last 168 hours.  BNP (last 3 results) Recent Labs    09/22/21 0427  BNP 1,446.6*    ProBNP (last 3 results) No results for input(s): PROBNP in the last 8760 hours.  Radiological Exams: No results found.  Assessment/Plan Active Problems:   Acute on chronic respiratory failure with hypoxia (HCC)   AF (paroxysmal atrial fibrillation) (HCC)   Severe sepsis (HCC)   Acute metabolic encephalopathy   Healthcare-associated pneumonia   Acute on chronic respiratory failure with hypoxia plan is going to continue with capping trials as ordered continue with secretion management pulmonary toilet. Atrial fibrillation rate is better controlled we will continue with medical management Severe sepsis treatedwith Metabolic encephalopathy no change Healthcare associated pneumonia has been treated   I have personally seen and evaluated the patient, evaluated laboratory and imaging results, formulated the assessment and plan and placed orders. The Patient requires high complexity decision making with multiple systems involvement.  Rounds were done with the Respiratory Therapy Director and Staff therapists and discussed with nursing staff also.  Yevonne Pax, MD [facility] Pulmonary Critical Care Medicine Sleep Medicine

## 2021-10-03 LAB — RENAL FUNCTION PANEL
Albumin: 1.7 g/dL — ABNORMAL LOW (ref 3.5–5.0)
Anion gap: 8 (ref 5–15)
BUN: 70 mg/dL — ABNORMAL HIGH (ref 8–23)
CO2: 21 mmol/L — ABNORMAL LOW (ref 22–32)
Calcium: 7.8 mg/dL — ABNORMAL LOW (ref 8.9–10.3)
Chloride: 112 mmol/L — ABNORMAL HIGH (ref 98–111)
Creatinine, Ser: 1.91 mg/dL — ABNORMAL HIGH (ref 0.61–1.24)
GFR, Estimated: 33 mL/min — ABNORMAL LOW (ref >60)
Glucose, Bld: 72 mg/dL (ref 70–99)
Phosphorus: 2.6 mg/dL (ref 2.5–4.6)
Potassium: 3.6 mmol/L (ref 3.5–5.1)
Sodium: 141 mmol/L (ref 135–145)

## 2021-10-03 LAB — MAGNESIUM: Magnesium: 2 mg/dL (ref 1.7–2.4)

## 2021-10-04 DIAGNOSIS — J9621 Acute and chronic respiratory failure with hypoxia: Secondary | ICD-10-CM | POA: Diagnosis not present

## 2021-10-04 DIAGNOSIS — I480 Paroxysmal atrial fibrillation: Secondary | ICD-10-CM | POA: Diagnosis not present

## 2021-10-04 DIAGNOSIS — J189 Pneumonia, unspecified organism: Secondary | ICD-10-CM | POA: Diagnosis not present

## 2021-10-04 DIAGNOSIS — G9341 Metabolic encephalopathy: Secondary | ICD-10-CM | POA: Diagnosis not present

## 2021-10-04 LAB — RENAL FUNCTION PANEL
Albumin: 1.7 g/dL — ABNORMAL LOW (ref 3.5–5.0)
Anion gap: 8 (ref 5–15)
BUN: 79 mg/dL — ABNORMAL HIGH (ref 8–23)
CO2: 23 mmol/L (ref 22–32)
Calcium: 8 mg/dL — ABNORMAL LOW (ref 8.9–10.3)
Chloride: 111 mmol/L (ref 98–111)
Creatinine, Ser: 1.87 mg/dL — ABNORMAL HIGH (ref 0.61–1.24)
GFR, Estimated: 34 mL/min — ABNORMAL LOW (ref >60)
Glucose, Bld: 179 mg/dL — ABNORMAL HIGH (ref 70–99)
Phosphorus: 1.8 mg/dL — ABNORMAL LOW (ref 2.5–4.6)
Potassium: 4 mmol/L (ref 3.5–5.1)
Sodium: 142 mmol/L (ref 135–145)

## 2021-10-04 NOTE — Progress Notes (Signed)
Pulmonary Critical Care Medicine [facility] [place]   PULMONARY CRITICAL CARE SERVICE  PROGRESS NOTE     SHERRILL MCKAMIE  SEG:[id number]  DOB: 1931/07/17   DOA: 09/21/2021  Referring Physician: Luna Kitchens, MD  HPI: Stephen Cobb is a 87 y.o. male being followed for ventilator/airway/oxygen weaning Acute on Chronic Respiratory Failure.  Patient is capping has been completing 3 days doing well.  Secretions are minimal to none  Medications: Reviewed on Rounds  Physical Exam:  Vitals: Temperature is 98.8 pulse 85 respiratory rate is 20 blood pressure is 137/70 saturations 97%  Ventilator Settings capping x3 days  General: Comfortable at this time Neck: supple Cardiovascular: no malignant arrhythmias Respiratory: No rhonchi no rales Skin: no rash seen on limited exam Musculoskeletal: No gross abnormality Psychiatric:unable to assess Neurologic:no involuntary movements         Lab Data:   Basic Metabolic Panel: Recent Labs  Lab 09/28/21 0448 09/29/21 1315 10/02/21 0544 10/03/21 0610 10/04/21 0551  NA  --  141 140 141 142  K 3.3* 3.6 4.1 3.6 4.0  CL  --  112* 113* 112* 111  CO2  --  21* 20* 21* 23  GLUCOSE  --  96 95 72 179*  BUN  --  67* 58* 70* 79*  CREATININE  --  1.87* 1.94* 1.91* 1.87*  CALCIUM  --  8.4* 7.8* 7.8* 8.0*  MG  --   --  1.6* 2.0  --   PHOS  --   --  3.2 2.6 1.8*    ABG: No results for input(s): PHART, PCO2ART, PO2ART, HCO3, O2SAT in the last 168 hours.  Liver Function Tests: Recent Labs  Lab 10/02/21 0544 10/03/21 0610 10/04/21 0551  ALBUMIN 1.6* 1.7* 1.7*   No results for input(s): LIPASE, AMYLASE in the last 168 hours. No results for input(s): AMMONIA in the last 168 hours.  CBC: Recent Labs  Lab 09/28/21 0913 10/02/21 0544  WBC 7.1 9.0  HGB 8.2* 9.3*  HCT 26.5* 30.0*  MCV 88.9 88.0  PLT 193 225    Cardiac Enzymes: No results for input(s): CKTOTAL, CKMB, CKMBINDEX, TROPONINI in the last 168  hours.  BNP (last 3 results) Recent Labs    09/22/21 0427  BNP 1,446.6*    ProBNP (last 3 results) No results for input(s): PROBNP in the last 8760 hours.  Radiological Exams: No results found.  Assessment/Plan Active Problems:   Acute on chronic respiratory failure with hypoxia (HCC)   AF (paroxysmal atrial fibrillation) (HCC)   Severe sepsis (HCC)   Acute metabolic encephalopathy   Healthcare-associated pneumonia   Acute on chronic respiratory failure with hypoxia plan is going to be to proceed to decannulation.  Patient has passed the protocol as far as capping trials are concerned.  Patient does not have significant secretions noted Atrial fibrillation rate is controlled we will continue with supportive care Severe sepsis treated resolved Acute metabolic encephalopathy no change Healthcare associated pneumonia clinically improved   I have personally seen and evaluated the patient, evaluated laboratory and imaging results, formulated the assessment and plan and placed orders. The Patient requires high complexity decision making with multiple systems involvement.  Rounds were done with the Respiratory Therapy Director and Staff therapists and discussed with nursing staff also.  Yevonne Pax, MD [facility] Pulmonary Critical Care Medicine Sleep Medicine

## 2021-10-05 LAB — CBC
HCT: 31 % — ABNORMAL LOW (ref 39.0–52.0)
Hemoglobin: 9.5 g/dL — ABNORMAL LOW (ref 13.0–17.0)
MCH: 26.8 pg (ref 26.0–34.0)
MCHC: 30.6 g/dL (ref 30.0–36.0)
MCV: 87.3 fL (ref 80.0–100.0)
Platelets: 189 10*3/uL (ref 150–400)
RBC: 3.55 MIL/uL — ABNORMAL LOW (ref 4.22–5.81)
RDW: 17.5 % — ABNORMAL HIGH (ref 11.5–15.5)
WBC: 9 10*3/uL (ref 4.0–10.5)
nRBC: 0 % (ref 0.0–0.2)

## 2021-10-05 LAB — RENAL FUNCTION PANEL
Albumin: 1.7 g/dL — ABNORMAL LOW (ref 3.5–5.0)
Anion gap: 9 (ref 5–15)
BUN: 89 mg/dL — ABNORMAL HIGH (ref 8–23)
CO2: 24 mmol/L (ref 22–32)
Calcium: 8.1 mg/dL — ABNORMAL LOW (ref 8.9–10.3)
Chloride: 110 mmol/L (ref 98–111)
Creatinine, Ser: 1.93 mg/dL — ABNORMAL HIGH (ref 0.61–1.24)
GFR, Estimated: 33 mL/min — ABNORMAL LOW (ref >60)
Glucose, Bld: 188 mg/dL — ABNORMAL HIGH (ref 70–99)
Phosphorus: 1.4 mg/dL — ABNORMAL LOW (ref 2.5–4.6)
Potassium: 4.2 mmol/L (ref 3.5–5.1)
Sodium: 143 mmol/L (ref 135–145)

## 2021-10-05 LAB — MAGNESIUM: Magnesium: 2.2 mg/dL (ref 1.7–2.4)

## 2021-10-06 LAB — RENAL FUNCTION PANEL
Albumin: 1.7 g/dL — ABNORMAL LOW (ref 3.5–5.0)
Anion gap: 10 (ref 5–15)
BUN: 97 mg/dL — ABNORMAL HIGH (ref 8–23)
CO2: 22 mmol/L (ref 22–32)
Calcium: 8.1 mg/dL — ABNORMAL LOW (ref 8.9–10.3)
Chloride: 109 mmol/L (ref 98–111)
Creatinine, Ser: 1.87 mg/dL — ABNORMAL HIGH (ref 0.61–1.24)
GFR, Estimated: 34 mL/min — ABNORMAL LOW (ref >60)
Glucose, Bld: 186 mg/dL — ABNORMAL HIGH (ref 70–99)
Phosphorus: 1.4 mg/dL — ABNORMAL LOW (ref 2.5–4.6)
Potassium: 4.5 mmol/L (ref 3.5–5.1)
Sodium: 141 mmol/L (ref 135–145)

## 2021-10-06 LAB — MAGNESIUM: Magnesium: 2.1 mg/dL (ref 1.7–2.4)

## 2021-10-07 LAB — MAGNESIUM: Magnesium: 2.4 mg/dL (ref 1.7–2.4)

## 2021-10-08 LAB — URINALYSIS, ROUTINE W REFLEX MICROSCOPIC
Bilirubin Urine: NEGATIVE
Glucose, UA: 50 mg/dL — AB
Ketones, ur: NEGATIVE mg/dL
Nitrite: NEGATIVE
Protein, ur: 30 mg/dL — AB
Specific Gravity, Urine: 1.014 (ref 1.005–1.030)
pH: 8 (ref 5.0–8.0)

## 2021-10-08 LAB — CBC
HCT: 32.2 % — ABNORMAL LOW (ref 39.0–52.0)
Hemoglobin: 10.1 g/dL — ABNORMAL LOW (ref 13.0–17.0)
MCH: 27.2 pg (ref 26.0–34.0)
MCHC: 31.4 g/dL (ref 30.0–36.0)
MCV: 86.6 fL (ref 80.0–100.0)
Platelets: 142 10*3/uL — ABNORMAL LOW (ref 150–400)
RBC: 3.72 MIL/uL — ABNORMAL LOW (ref 4.22–5.81)
RDW: 17.8 % — ABNORMAL HIGH (ref 11.5–15.5)
WBC: 10 10*3/uL (ref 4.0–10.5)
nRBC: 0 % (ref 0.0–0.2)

## 2021-10-08 LAB — RENAL FUNCTION PANEL
Albumin: 1.8 g/dL — ABNORMAL LOW (ref 3.5–5.0)
Anion gap: 8 (ref 5–15)
BUN: 103 mg/dL — ABNORMAL HIGH (ref 8–23)
CO2: 26 mmol/L (ref 22–32)
Calcium: 8.3 mg/dL — ABNORMAL LOW (ref 8.9–10.3)
Chloride: 108 mmol/L (ref 98–111)
Creatinine, Ser: 1.86 mg/dL — ABNORMAL HIGH (ref 0.61–1.24)
GFR, Estimated: 34 mL/min — ABNORMAL LOW (ref >60)
Glucose, Bld: 147 mg/dL — ABNORMAL HIGH (ref 70–99)
Phosphorus: 1.4 mg/dL — ABNORMAL LOW (ref 2.5–4.6)
Potassium: 4.7 mmol/L (ref 3.5–5.1)
Sodium: 142 mmol/L (ref 135–145)

## 2021-10-08 LAB — MAGNESIUM: Magnesium: 2.2 mg/dL (ref 1.7–2.4)

## 2021-10-09 LAB — URINE CULTURE: Culture: >=100000 — AB

## 2021-10-10 ENCOUNTER — Other Ambulatory Visit (HOSPITAL_COMMUNITY): Payer: Medicare Other

## 2021-10-10 LAB — CBC
HCT: 28 % — ABNORMAL LOW (ref 39.0–52.0)
Hemoglobin: 8.7 g/dL — ABNORMAL LOW (ref 13.0–17.0)
MCH: 27.1 pg (ref 26.0–34.0)
MCHC: 31.1 g/dL (ref 30.0–36.0)
MCV: 87.2 fL (ref 80.0–100.0)
Platelets: 125 10*3/uL — ABNORMAL LOW (ref 150–400)
RBC: 3.21 MIL/uL — ABNORMAL LOW (ref 4.22–5.81)
RDW: 18 % — ABNORMAL HIGH (ref 11.5–15.5)
WBC: 9.9 10*3/uL (ref 4.0–10.5)
nRBC: 0.4 % — ABNORMAL HIGH (ref 0.0–0.2)

## 2021-10-10 LAB — RENAL FUNCTION PANEL
Albumin: 1.7 g/dL — ABNORMAL LOW (ref 3.5–5.0)
Anion gap: 5 (ref 5–15)
BUN: 94 mg/dL — ABNORMAL HIGH (ref 8–23)
CO2: 25 mmol/L (ref 22–32)
Calcium: 8.3 mg/dL — ABNORMAL LOW (ref 8.9–10.3)
Chloride: 114 mmol/L — ABNORMAL HIGH (ref 98–111)
Creatinine, Ser: 2.04 mg/dL — ABNORMAL HIGH (ref 0.61–1.24)
GFR, Estimated: 31 mL/min — ABNORMAL LOW (ref >60)
Glucose, Bld: 216 mg/dL — ABNORMAL HIGH (ref 70–99)
Phosphorus: 2.6 mg/dL (ref 2.5–4.6)
Potassium: 5.8 mmol/L — ABNORMAL HIGH (ref 3.5–5.1)
Sodium: 144 mmol/L (ref 135–145)

## 2021-10-10 LAB — POTASSIUM
Potassium: 5.7 mmol/L — ABNORMAL HIGH (ref 3.5–5.1)
Potassium: 5.7 mmol/L — ABNORMAL HIGH (ref 3.5–5.1)

## 2021-10-10 LAB — MAGNESIUM: Magnesium: 2.1 mg/dL (ref 1.7–2.4)

## 2021-10-11 ENCOUNTER — Other Ambulatory Visit (HOSPITAL_COMMUNITY): Payer: Medicare Other

## 2021-10-11 LAB — CULTURE, RESPIRATORY W GRAM STAIN

## 2021-10-11 LAB — RENAL FUNCTION PANEL
Albumin: 1.8 g/dL — ABNORMAL LOW (ref 3.5–5.0)
Anion gap: 8 (ref 5–15)
BUN: 82 mg/dL — ABNORMAL HIGH (ref 8–23)
CO2: 27 mmol/L (ref 22–32)
Calcium: 8.6 mg/dL — ABNORMAL LOW (ref 8.9–10.3)
Chloride: 115 mmol/L — ABNORMAL HIGH (ref 98–111)
Creatinine, Ser: 1.96 mg/dL — ABNORMAL HIGH (ref 0.61–1.24)
GFR, Estimated: 32 mL/min — ABNORMAL LOW (ref >60)
Glucose, Bld: 120 mg/dL — ABNORMAL HIGH (ref 70–99)
Phosphorus: 2.6 mg/dL (ref 2.5–4.6)
Potassium: 4.2 mmol/L (ref 3.5–5.1)
Sodium: 150 mmol/L — ABNORMAL HIGH (ref 135–145)

## 2021-10-11 LAB — CBC
HCT: 29.1 % — ABNORMAL LOW (ref 39.0–52.0)
Hemoglobin: 8.6 g/dL — ABNORMAL LOW (ref 13.0–17.0)
MCH: 26 pg (ref 26.0–34.0)
MCHC: 29.6 g/dL — ABNORMAL LOW (ref 30.0–36.0)
MCV: 87.9 fL (ref 80.0–100.0)
Platelets: UNDETERMINED 10*3/uL (ref 150–400)
RBC: 3.31 MIL/uL — ABNORMAL LOW (ref 4.22–5.81)
RDW: 17.8 % — ABNORMAL HIGH (ref 11.5–15.5)
WBC: 12.1 10*3/uL — ABNORMAL HIGH (ref 4.0–10.5)
nRBC: 0 % (ref 0.0–0.2)

## 2021-10-11 LAB — MAGNESIUM: Magnesium: 2 mg/dL (ref 1.7–2.4)

## 2021-10-12 LAB — BASIC METABOLIC PANEL
Anion gap: 10 (ref 5–15)
BUN: 82 mg/dL — ABNORMAL HIGH (ref 8–23)
CO2: 25 mmol/L (ref 22–32)
Calcium: 8.4 mg/dL — ABNORMAL LOW (ref 8.9–10.3)
Chloride: 115 mmol/L — ABNORMAL HIGH (ref 98–111)
Creatinine, Ser: 1.76 mg/dL — ABNORMAL HIGH (ref 0.61–1.24)
GFR, Estimated: 37 mL/min — ABNORMAL LOW (ref >60)
Glucose, Bld: 126 mg/dL — ABNORMAL HIGH (ref 70–99)
Potassium: 4.1 mmol/L (ref 3.5–5.1)
Sodium: 150 mmol/L — ABNORMAL HIGH (ref 135–145)

## 2021-10-13 LAB — RENAL FUNCTION PANEL
Albumin: 2.1 g/dL — ABNORMAL LOW (ref 3.5–5.0)
Anion gap: 10 (ref 5–15)
BUN: 80 mg/dL — ABNORMAL HIGH (ref 8–23)
CO2: 24 mmol/L (ref 22–32)
Calcium: 8.7 mg/dL — ABNORMAL LOW (ref 8.9–10.3)
Chloride: 114 mmol/L — ABNORMAL HIGH (ref 98–111)
Creatinine, Ser: 1.96 mg/dL — ABNORMAL HIGH (ref 0.61–1.24)
GFR, Estimated: 32 mL/min — ABNORMAL LOW (ref >60)
Glucose, Bld: 163 mg/dL — ABNORMAL HIGH (ref 70–99)
Phosphorus: 3.3 mg/dL (ref 2.5–4.6)
Potassium: 4.2 mmol/L (ref 3.5–5.1)
Sodium: 148 mmol/L — ABNORMAL HIGH (ref 135–145)

## 2021-10-13 LAB — CBC
HCT: 26.4 % — ABNORMAL LOW (ref 39.0–52.0)
Hemoglobin: 7.8 g/dL — ABNORMAL LOW (ref 13.0–17.0)
MCH: 25.8 pg — ABNORMAL LOW (ref 26.0–34.0)
MCHC: 29.5 g/dL — ABNORMAL LOW (ref 30.0–36.0)
MCV: 87.4 fL (ref 80.0–100.0)
Platelets: 143 10*3/uL — ABNORMAL LOW (ref 150–400)
RBC: 3.02 MIL/uL — ABNORMAL LOW (ref 4.22–5.81)
RDW: 18.1 % — ABNORMAL HIGH (ref 11.5–15.5)
WBC: 10.4 10*3/uL (ref 4.0–10.5)
nRBC: 0 % (ref 0.0–0.2)

## 2021-10-13 LAB — MAGNESIUM: Magnesium: 2 mg/dL (ref 1.7–2.4)

## 2021-10-13 LAB — CULTURE, BLOOD (ROUTINE X 2)
Culture: NO GROWTH
Culture: NO GROWTH

## 2021-10-14 LAB — SODIUM: Sodium: 147 mmol/L — ABNORMAL HIGH (ref 135–145)

## 2021-10-15 LAB — RENAL FUNCTION PANEL
Albumin: 1.7 g/dL — ABNORMAL LOW (ref 3.5–5.0)
Anion gap: 7 (ref 5–15)
BUN: 113 mg/dL — ABNORMAL HIGH (ref 8–23)
CO2: 24 mmol/L (ref 22–32)
Calcium: 8.9 mg/dL (ref 8.9–10.3)
Chloride: 116 mmol/L — ABNORMAL HIGH (ref 98–111)
Creatinine, Ser: 1.96 mg/dL — ABNORMAL HIGH (ref 0.61–1.24)
GFR, Estimated: 32 mL/min — ABNORMAL LOW (ref >60)
Glucose, Bld: 154 mg/dL — ABNORMAL HIGH (ref 70–99)
Phosphorus: 3 mg/dL (ref 2.5–4.6)
Potassium: 4.6 mmol/L (ref 3.5–5.1)
Sodium: 147 mmol/L — ABNORMAL HIGH (ref 135–145)

## 2021-10-15 LAB — CBC
HCT: 26.6 % — ABNORMAL LOW (ref 39.0–52.0)
Hemoglobin: 7.7 g/dL — ABNORMAL LOW (ref 13.0–17.0)
MCH: 25.8 pg — ABNORMAL LOW (ref 26.0–34.0)
MCHC: 28.9 g/dL — ABNORMAL LOW (ref 30.0–36.0)
MCV: 89 fL (ref 80.0–100.0)
Platelets: 140 10*3/uL — ABNORMAL LOW (ref 150–400)
RBC: 2.99 MIL/uL — ABNORMAL LOW (ref 4.22–5.81)
RDW: 17.9 % — ABNORMAL HIGH (ref 11.5–15.5)
WBC: 8.4 10*3/uL (ref 4.0–10.5)
nRBC: 0 % (ref 0.0–0.2)

## 2021-10-15 LAB — VANCOMYCIN, TROUGH: Vancomycin Tr: 15 ug/mL (ref 15–20)

## 2021-10-15 LAB — MAGNESIUM: Magnesium: 2 mg/dL (ref 1.7–2.4)

## 2021-10-15 NOTE — Progress Notes (Addendum)
PROGRESS NOTE    Stephen Cobb  [id number] DOB: 1930-12-21 DOA: 09/21/2021   Brief Narrative:  Stephen Cobb is an 87 y.o. male with history of previous CVA, paroxysmal atrial fibrillation on Coumadin, hypertension, diabetes mellitus type 2, chronic systolic congestive heart failure, stage IV chronic kidney disease, chronic sacral decubitus ulcer, peripheral vascular disease with bilateral BKA's who presented to the acute facility due to worsening confusion, altered mental status, tachycardia.  He was also found to be hypotensive with elevated WBC count with elevated lactic acid.  He was started on empiric IV vancomycin, Zosyn.  However, he had persistent hypotension and had to be started on pressors at the acute facility.  He was found to be in sepsis with shock secondary to UTI, pressure ulcers, C. difficile infection.  He was started on oral vancomycin with a 6-week taper with end date 09/30/2021.  Eventually once improved he was able to be weaned off pressors.  He received treatment with antibiotics for UTI and pressure ulcers.  He had ventilator dependent respiratory failure status post trach and PEG on 09/07/2021. On 09/15/2021 he was found to have purulent penile discharge, he had suprapubic catheter placement.  Due to his complex medical problems he was transferred and admitted to [facility].  After admission here he had tracheal aspirate cultures collected on 09/21/2021 which per report showing moderate gram-positive rods, abundant diphtheroids, rare Stenotrophomonas maltophilia.  He had urine cultures collected on 09/21/2021 showed 100,000 colonies per mL of Klebsiella pneumonia, ESBL for which he was treated.  Respiratory cultures from 10/08/2021 showed MRSA.  He is also having diarrhea per the nursing staff.  Assessment & Plan:  Acute on chronic respiratory failure with hypoxia (HCC)   Severe sepsis with septic shock, currently shock resolved UTI with Klebsiella  pneumonia, ESBL Healthcare-associated pneumonia with MRSA C. difficile infection Sacrococcygeal pressure ulcer stage IV present on admission Left posterior knee pressure injury stage IV Right ischial pressure injury unstageable Chronic renal failure stage IV AF (paroxysmal atrial fibrillation) (HCC) Systolic congestive heart failure with EF 25%   Encephalopathy    Closed fracture of neck of left femur  Severe peripheral vascular disease with bilateral lower extremity amputation Dysphagia/protein calorie malnutrition   Acute on chronic respiratory failure with hypoxemia: He presented with sepsis and shock to the acute facility.  Subsequently intubated. Pulmonary following.  His previous respiratory cultures showed diphtheroids and Stenotrophomonas maltophilia for which he received treatment with meropenem, Levaquin.  Recent respiratory cultures from 10/08/2021 showed moderate growth MRSA, Candida.  Recommend to treat with fluconazole.  He is on IV vancomycin for the MRSA.  Plan treat for duration of 1 week pending improvement.  He has chronic kidney disease and elevated creatinine at baseline.  Recommend to monitor BUN/creatinine very closely while on antibiotics and also cautious diuresis especially while on antibiotics to prevent worsening renal failure.  If he is having worsening creatinine recommend to stop the IV vancomycin immediately and switch him to Zyvox to complete the treatment duration.  Monitor Vanco levels closely.  However, he also has dysphagia and high risk for ongoing aspiration and recurrent aspiration pneumonia despite being on antibiotics.  If his respiratory status worsens recommend to repeat respiratory cultures and chest imaging preferably chest CT without contrast due to concern for acute on chronic renal failure.  Again, please monitor CBC, BUN/creatinine closely while on antibiotics.   Severe sepsis with septic shock: Patient was on pressors at the acute facility.  The  sepsis was  secondary to UTI, C. difficile infection.  Currently shock resolved.  He had previous urine cultures that showed greater than 100,000 colonies per mL of Klebsiella pneumonia ESBL for which he already received treatment with antibiotics.  Now restarted on antibiotics again. If he starts having any worsening fevers or worsening leukocytosis recommend to send for repeat pancultures and imaging.  Please monitor CBC, BUN/creatinine closely while on antibiotics.   UTI: Urine cultures previously showed greater than 100,000 colonies per mL of Klebsiella pneumonia, ESBL for which she already received treatment.  He has a suprapubic catheter and high risk for recurrent UTI.     Pneumonia: Recent respiratory cultures showing MRSA, Candida.  Antimicrobials and plan as mentioned above.  However, as mentioned above he has dysphagia and high risk for aspiration and worsening aspiration pneumonia despite being on antibiotics.  If his respiratory status worsens recommend to send for repeat respiratory cultures and repeat chest imaging preferably chest CT which can be done without contrast due to concern for renal compromise.   C. difficile infection: He completed 6-week taper of p.o. vancomycin.  However, now having diarrhea again per the nursing staff. Recommend to restart him on p.o. vancomycin and plan to treat for duration of 10 days pending improvement. If he starts having worsening fevers, worsening leukocytosis would recommend to check CT of the chest/abdomen and pelvis to evaluate for recurrent and worsening pneumonia as well as worsening colitis.   Sacrococcygeal pressure ulcer stage IV present on admission/Left posterior knee pressure injury stage IV/ Right ischial pressure injury unstageable: Continue local wound care.  Due to his debility and decreased mobility he is high risk for worsening of the pressure injuries.  Received treatment with multiple rounds of antibiotics.  If any worsening of the  wound, consider consulting surgery to evaluate for probable debridement.   Acute on chronic renal failure stage IV: Continue to monitor BUN/creatinine.  Further management per the primary team.  Avoid nephrotoxic medications.   AF (paroxysmal atrial fibrillation): Continue medications and management per the primary team.   Systolic congestive heart failure with EF 25%: Management per primary team.  Cardiology consulted by the primary team.   Encephalopathy: Likely toxic/metabolic.  On antibiotics as mentioned above.  Continue supportive management per primary team.   Closed fracture of neck of left femur: Ortho consulted by the primary team.  Further management per primary team.   Severe peripheral vascular disease with bilateral BKA: He has contractures with skin breakdown.  Continue local wound care and supportive management per primary team.   Dysphagia/protein calorie malnutrition: Due to his dysphagia he is high risk for recurrent aspiration and aspiration pneumonia despite being on antibiotics.  Management of PCM per the primary team. Unfortunately due to his complex medical problems, age-related debility he is very high risk for worsening and decompensation. Plan of care discussed with the primary team and pharmacy.   Subjective: Remains encephalopathic.  On oxygen by nasal cannula.  Objective: Vitals: Temperature 97.8, pulse 68, respiratory 21, blood pressure 130/68, oxygen saturation 95% Examination: Constitutional: Ill-appearing male, encephalopathic Head: Atraumatic, normocephalic Eyes: PERLA, EOMI, irises appear normal, anicteric sclera,  ENMT: external ears and nose appear normal, normal hearing, moist oral mucosa   Neck: Supple CVS: S1-S2   Respiratory: Rhonchi, no wheezing Abdomen: soft nontender, nondistended, normal bowel sounds Musculoskeletal: Bilateral BKA with contractures Neuro: Confused, encephalopathic, limited neuro exam Skin: no rashes, multiple pressure  injuries as mentioned above.  Please see wound care note for complete details.  Data Reviewed: I have personally reviewed following labs and imaging studies  CBC: Recent Labs  Lab 10/10/21 0655 10/11/21 0418 10/13/21 0317 10/15/21 0957  WBC 9.9 12.1* 10.4 8.4  HGB 8.7* 8.6* 7.8* 7.7*  HCT 28.0* 29.1* 26.4* 26.6*  MCV 87.2 87.9 87.4 89.0  PLT 125* PLATELET CLUMPS NOTED ON SMEAR, UNABLE TO ESTIMATE 143* 140*    Basic Metabolic Panel: Recent Labs  Lab 10/10/21 0655 10/10/21 1006 10/10/21 2019 10/11/21 0418 10/12/21 0353 10/13/21 0317 10/14/21 0309 10/15/21 0957  NA 144  --   --  150* 150* 148* 147* 147*  K 5.8*   < > 5.7* 4.2 4.1 4.2  --  4.6  CL 114*  --   --  115* 115* 114*  --  116*  CO2 25  --   --  27 25 24   --  24  GLUCOSE 216*  --   --  120* 126* 163*  --  154*  BUN 94*  --   --  82* 82* 80*  --  113*  CREATININE 2.04*  --   --  1.96* 1.76* 1.96*  --  1.96*  CALCIUM 8.3*  --   --  8.6* 8.4* 8.7*  --  8.9  MG 2.1  --   --  2.0  --  2.0  --  2.0  PHOS 2.6  --   --  2.6  --  3.3  --  3.0   < > = values in this interval not displayed.    GFR: CrCl cannot be calculated (Unknown ideal weight.).  Liver Function Tests: Recent Labs  Lab 10/10/21 0655 10/11/21 0418 10/13/21 0317 10/15/21 0957  ALBUMIN 1.7* 1.8* 2.1* 1.7*    CBG: No results for input(s): GLUCAP in the last 168 hours.   Recent Results (from the past 240 hour(s))  Urine Culture     Status: Abnormal   Collection Time: 10/08/21  8:38 AM   Specimen: Urine, Catheterized  Result Value Ref Range Status   Specimen Description URINE, CATHETERIZED  Final   Special Requests   Final    NONE Performed at [facility], [address], [place], [place]    Culture >=100,000 COLONIES/mL YEAST (A)  Final   Report Status 10/09/2021 FINAL  Final  Culture, Respiratory w Gram Stain     Status: None   Collection Time: 10/08/21  9:00 AM   Specimen: Tracheal Aspirate; Respiratory  Result  Value Ref Range Status   Specimen Description TRACHEAL ASPIRATE  Final   Special Requests NONE  Final   Gram Stain   Final    FEW WBC PRESENT, PREDOMINANTLY PMN MODERATE GRAM POSITIVE COCCI IN PAIRS IN CLUSTERS MODERATE BUDDING YEAST SEEN RARE GRAM POSITIVE RODS Performed at [facility], [address] [place], [place]    Culture   Final    MODERATE METHICILLIN RESISTANT STAPHYLOCOCCUS AUREUS MODERATE CANDIDA PARAPSILOSIS    Report Status 10/11/2021 FINAL  Final   Organism ID, Bacteria METHICILLIN RESISTANT STAPHYLOCOCCUS AUREUS  Final      Susceptibility   Methicillin resistant staphylococcus aureus - MIC*    CIPROFLOXACIN >=8 RESISTANT Resistant     ERYTHROMYCIN >=8 RESISTANT Resistant     GENTAMICIN <=0.5 SENSITIVE Sensitive     OXACILLIN >=4 RESISTANT Resistant     TETRACYCLINE >=16 RESISTANT Resistant     VANCOMYCIN 1 SENSITIVE Sensitive     TRIMETH/SULFA <=10 SENSITIVE Sensitive     CLINDAMYCIN >=8 RESISTANT  Resistant     RIFAMPIN <=0.5 SENSITIVE Sensitive     Inducible Clindamycin NEGATIVE Sensitive     * MODERATE METHICILLIN RESISTANT STAPHYLOCOCCUS AUREUS  Culture, blood (routine x 2)     Status: None   Collection Time: 10/08/21  9:59 AM   Specimen: BLOOD RIGHT ARM  Result Value Ref Range Status   Specimen Description BLOOD RIGHT ARM  Final   Special Requests   Final    BOTTLES DRAWN AEROBIC ONLY Blood Culture results may not be optimal due to an inadequate volume of blood received in culture bottles   Culture   Final    NO GROWTH 5 DAYS Performed at [facility], [place] [address], [place], [place] [address]    Report Status 10/13/2021 FINAL  Final  Culture, blood (routine x 2)     Status: None   Collection Time: 10/08/21 10:05 AM   Specimen: BLOOD LEFT HAND  Result Value Ref Range Status   Specimen Description BLOOD LEFT HAND  Final   Special Requests   Final    BOTTLES DRAWN AEROBIC ONLY Blood Culture results may not be optimal due to an  inadequate volume of blood received in culture bottles   Culture   Final    NO GROWTH 5 DAYS Performed at [facility], [place] [address], [place], [place] [address]    Report Status 10/13/2021 FINAL  Final     Radiology Studies: No results found.  Scheduled Meds: Please see MAR   Yaakov Guthrie, MD  10/15/2021, 7:24 PM

## 2021-10-17 ENCOUNTER — Other Ambulatory Visit (HOSPITAL_COMMUNITY): Payer: Medicare Other

## 2021-10-17 LAB — BASIC METABOLIC PANEL
Anion gap: 7 (ref 5–15)
BUN: 107 mg/dL — ABNORMAL HIGH (ref 8–23)
CO2: 26 mmol/L (ref 22–32)
Calcium: 8.2 mg/dL — ABNORMAL LOW (ref 8.9–10.3)
Chloride: 113 mmol/L — ABNORMAL HIGH (ref 98–111)
Creatinine, Ser: 2.05 mg/dL — ABNORMAL HIGH (ref 0.61–1.24)
GFR, Estimated: 31 mL/min — ABNORMAL LOW (ref >60)
Glucose, Bld: 165 mg/dL — ABNORMAL HIGH (ref 70–99)
Potassium: 4.6 mmol/L (ref 3.5–5.1)
Sodium: 146 mmol/L — ABNORMAL HIGH (ref 135–145)

## 2021-10-18 LAB — CBC
HCT: 25.7 % — ABNORMAL LOW (ref 39.0–52.0)
Hemoglobin: 7.5 g/dL — ABNORMAL LOW (ref 13.0–17.0)
MCH: 26 pg (ref 26.0–34.0)
MCHC: 29.2 g/dL — ABNORMAL LOW (ref 30.0–36.0)
MCV: 88.9 fL (ref 80.0–100.0)
Platelets: 183 10*3/uL (ref 150–400)
RBC: 2.89 MIL/uL — ABNORMAL LOW (ref 4.22–5.81)
RDW: 18.2 % — ABNORMAL HIGH (ref 11.5–15.5)
WBC: 9.8 10*3/uL (ref 4.0–10.5)
nRBC: 0.3 % — ABNORMAL HIGH (ref 0.0–0.2)

## 2021-10-18 LAB — BASIC METABOLIC PANEL
Anion gap: 9 (ref 5–15)
BUN: 106 mg/dL — ABNORMAL HIGH (ref 8–23)
CO2: 24 mmol/L (ref 22–32)
Calcium: 8.4 mg/dL — ABNORMAL LOW (ref 8.9–10.3)
Chloride: 111 mmol/L (ref 98–111)
Creatinine, Ser: 2.16 mg/dL — ABNORMAL HIGH (ref 0.61–1.24)
GFR, Estimated: 29 mL/min — ABNORMAL LOW (ref >60)
Glucose, Bld: 111 mg/dL — ABNORMAL HIGH (ref 70–99)
Potassium: 4.5 mmol/L (ref 3.5–5.1)
Sodium: 144 mmol/L (ref 135–145)

## 2021-10-18 LAB — MAGNESIUM: Magnesium: 2.1 mg/dL (ref 1.7–2.4)

## 2021-10-19 ENCOUNTER — Other Ambulatory Visit (HOSPITAL_COMMUNITY): Payer: Medicare Other

## 2021-10-19 LAB — RENAL FUNCTION PANEL
Albumin: 1.8 g/dL — ABNORMAL LOW (ref 3.5–5.0)
Anion gap: 9 (ref 5–15)
BUN: 115 mg/dL — ABNORMAL HIGH (ref 8–23)
CO2: 23 mmol/L (ref 22–32)
Calcium: 8.2 mg/dL — ABNORMAL LOW (ref 8.9–10.3)
Chloride: 108 mmol/L (ref 98–111)
Creatinine, Ser: 2.23 mg/dL — ABNORMAL HIGH (ref 0.61–1.24)
GFR, Estimated: 28 mL/min — ABNORMAL LOW (ref >60)
Glucose, Bld: 186 mg/dL — ABNORMAL HIGH (ref 70–99)
Phosphorus: 3.9 mg/dL (ref 2.5–4.6)
Potassium: 4.9 mmol/L (ref 3.5–5.1)
Sodium: 140 mmol/L (ref 135–145)

## 2021-10-19 LAB — CBC
HCT: 23.7 % — ABNORMAL LOW (ref 39.0–52.0)
Hemoglobin: 6.9 g/dL — CL (ref 13.0–17.0)
MCH: 25.4 pg — ABNORMAL LOW (ref 26.0–34.0)
MCHC: 29.1 g/dL — ABNORMAL LOW (ref 30.0–36.0)
MCV: 87.1 fL (ref 80.0–100.0)
Platelets: 182 10*3/uL (ref 150–400)
RBC: 2.72 MIL/uL — ABNORMAL LOW (ref 4.22–5.81)
RDW: 18.2 % — ABNORMAL HIGH (ref 11.5–15.5)
WBC: 9.4 10*3/uL (ref 4.0–10.5)
nRBC: 0.5 % — ABNORMAL HIGH (ref 0.0–0.2)

## 2021-10-19 LAB — MAGNESIUM: Magnesium: 2.2 mg/dL (ref 1.7–2.4)

## 2021-10-19 LAB — PREPARE RBC (CROSSMATCH)

## 2021-10-20 LAB — BPAM RBC
Blood Product Expiration Date: 202212202359
ISSUE DATE / TIME: 202211221540
Unit Type and Rh: 5100

## 2021-10-20 LAB — CBC
HCT: 27.4 % — ABNORMAL LOW (ref 39.0–52.0)
Hemoglobin: 8.1 g/dL — ABNORMAL LOW (ref 13.0–17.0)
MCH: 25.6 pg — ABNORMAL LOW (ref 26.0–34.0)
MCHC: 29.6 g/dL — ABNORMAL LOW (ref 30.0–36.0)
MCV: 86.4 fL (ref 80.0–100.0)
Platelets: 180 10*3/uL (ref 150–400)
RBC: 3.17 MIL/uL — ABNORMAL LOW (ref 4.22–5.81)
RDW: 17.3 % — ABNORMAL HIGH (ref 11.5–15.5)
WBC: 8.2 10*3/uL (ref 4.0–10.5)
nRBC: 0.4 % — ABNORMAL HIGH (ref 0.0–0.2)

## 2021-10-20 LAB — RENAL FUNCTION PANEL
Albumin: 1.8 g/dL — ABNORMAL LOW (ref 3.5–5.0)
Anion gap: 10 (ref 5–15)
BUN: 123 mg/dL — ABNORMAL HIGH (ref 8–23)
CO2: 22 mmol/L (ref 22–32)
Calcium: 8.3 mg/dL — ABNORMAL LOW (ref 8.9–10.3)
Chloride: 107 mmol/L (ref 98–111)
Creatinine, Ser: 2.27 mg/dL — ABNORMAL HIGH (ref 0.61–1.24)
GFR, Estimated: 27 mL/min — ABNORMAL LOW (ref >60)
Glucose, Bld: 174 mg/dL — ABNORMAL HIGH (ref 70–99)
Phosphorus: 3.9 mg/dL (ref 2.5–4.6)
Potassium: 4.8 mmol/L (ref 3.5–5.1)
Sodium: 139 mmol/L (ref 135–145)

## 2021-10-20 LAB — TYPE AND SCREEN
ABO/RH(D): O POS
Antibody Screen: NEGATIVE
Unit division: 0

## 2021-10-20 LAB — MAGNESIUM: Magnesium: 2.2 mg/dL (ref 1.7–2.4)

## 2021-10-21 LAB — RENAL FUNCTION PANEL
Albumin: 1.7 g/dL — ABNORMAL LOW (ref 3.5–5.0)
Anion gap: 7 (ref 5–15)
BUN: 115 mg/dL — ABNORMAL HIGH (ref 8–23)
CO2: 24 mmol/L (ref 22–32)
Calcium: 8.7 mg/dL — ABNORMAL LOW (ref 8.9–10.3)
Chloride: 108 mmol/L (ref 98–111)
Creatinine, Ser: 2.1 mg/dL — ABNORMAL HIGH (ref 0.61–1.24)
GFR, Estimated: 30 mL/min — ABNORMAL LOW (ref >60)
Glucose, Bld: 107 mg/dL — ABNORMAL HIGH (ref 70–99)
Phosphorus: 3.2 mg/dL (ref 2.5–4.6)
Potassium: 4.2 mmol/L (ref 3.5–5.1)
Sodium: 139 mmol/L (ref 135–145)

## 2021-10-21 LAB — CBC
HCT: 27.6 % — ABNORMAL LOW (ref 39.0–52.0)
Hemoglobin: 8.6 g/dL — ABNORMAL LOW (ref 13.0–17.0)
MCH: 26.5 pg (ref 26.0–34.0)
MCHC: 31.2 g/dL (ref 30.0–36.0)
MCV: 84.9 fL (ref 80.0–100.0)
Platelets: 200 10*3/uL (ref 150–400)
RBC: 3.25 MIL/uL — ABNORMAL LOW (ref 4.22–5.81)
RDW: 18 % — ABNORMAL HIGH (ref 11.5–15.5)
WBC: 8.7 10*3/uL (ref 4.0–10.5)
nRBC: 0.2 % (ref 0.0–0.2)

## 2021-10-21 LAB — MAGNESIUM: Magnesium: 2.3 mg/dL (ref 1.7–2.4)

## 2021-10-23 ENCOUNTER — Other Ambulatory Visit (HOSPITAL_COMMUNITY): Payer: Medicare Other

## 2021-10-23 LAB — RENAL FUNCTION PANEL
Albumin: 1.8 g/dL — ABNORMAL LOW (ref 3.5–5.0)
Anion gap: 9 (ref 5–15)
BUN: 89 mg/dL — ABNORMAL HIGH (ref 8–23)
CO2: 23 mmol/L (ref 22–32)
Calcium: 8.8 mg/dL — ABNORMAL LOW (ref 8.9–10.3)
Chloride: 106 mmol/L (ref 98–111)
Creatinine, Ser: 2.05 mg/dL — ABNORMAL HIGH (ref 0.61–1.24)
GFR, Estimated: 31 mL/min — ABNORMAL LOW (ref >60)
Glucose, Bld: 102 mg/dL — ABNORMAL HIGH (ref 70–99)
Phosphorus: 3 mg/dL (ref 2.5–4.6)
Potassium: 3.7 mmol/L (ref 3.5–5.1)
Sodium: 138 mmol/L (ref 135–145)

## 2021-10-23 LAB — EXPECTORATED SPUTUM ASSESSMENT W GRAM STAIN, RFLX TO RESP C

## 2021-10-23 LAB — CBC
HCT: 30 % — ABNORMAL LOW (ref 39.0–52.0)
Hemoglobin: 8.9 g/dL — ABNORMAL LOW (ref 13.0–17.0)
MCH: 25.6 pg — ABNORMAL LOW (ref 26.0–34.0)
MCHC: 29.7 g/dL — ABNORMAL LOW (ref 30.0–36.0)
MCV: 86.5 fL (ref 80.0–100.0)
Platelets: 257 10*3/uL (ref 150–400)
RBC: 3.47 MIL/uL — ABNORMAL LOW (ref 4.22–5.81)
RDW: 18.2 % — ABNORMAL HIGH (ref 11.5–15.5)
WBC: 12.9 10*3/uL — ABNORMAL HIGH (ref 4.0–10.5)
nRBC: 0 % (ref 0.0–0.2)

## 2021-10-23 LAB — URINALYSIS, ROUTINE W REFLEX MICROSCOPIC
Bilirubin Urine: NEGATIVE
Glucose, UA: NEGATIVE mg/dL
Ketones, ur: NEGATIVE mg/dL
Nitrite: POSITIVE — AB
Protein, ur: 30 mg/dL — AB
Specific Gravity, Urine: 1.014 (ref 1.005–1.030)
pH: 6 (ref 5.0–8.0)

## 2021-10-23 LAB — MAGNESIUM: Magnesium: 1.9 mg/dL (ref 1.7–2.4)

## 2021-10-25 ENCOUNTER — Other Ambulatory Visit (HOSPITAL_COMMUNITY): Payer: Medicare Other

## 2021-10-25 ENCOUNTER — Other Ambulatory Visit (HOSPITAL_COMMUNITY): Payer: Self-pay

## 2021-10-25 ENCOUNTER — Other Ambulatory Visit: Payer: Self-pay

## 2021-10-25 LAB — RENAL FUNCTION PANEL
Albumin: 1.5 g/dL — ABNORMAL LOW (ref 3.5–5.0)
Anion gap: 11 (ref 5–15)
BUN: 82 mg/dL — ABNORMAL HIGH (ref 8–23)
CO2: 20 mmol/L — ABNORMAL LOW (ref 22–32)
Calcium: 8.4 mg/dL — ABNORMAL LOW (ref 8.9–10.3)
Chloride: 112 mmol/L — ABNORMAL HIGH (ref 98–111)
Creatinine, Ser: 2.11 mg/dL — ABNORMAL HIGH (ref 0.61–1.24)
GFR, Estimated: 30 mL/min — ABNORMAL LOW (ref >60)
Glucose, Bld: 115 mg/dL — ABNORMAL HIGH (ref 70–99)
Phosphorus: 3.2 mg/dL (ref 2.5–4.6)
Potassium: 2.4 mmol/L — CL (ref 3.5–5.1)
Sodium: 143 mmol/L (ref 135–145)

## 2021-10-25 LAB — CBC
HCT: 27.7 % — ABNORMAL LOW (ref 39.0–52.0)
Hemoglobin: 8.3 g/dL — ABNORMAL LOW (ref 13.0–17.0)
MCH: 25.6 pg — ABNORMAL LOW (ref 26.0–34.0)
MCHC: 30 g/dL (ref 30.0–36.0)
MCV: 85.5 fL (ref 80.0–100.0)
Platelets: 224 10*3/uL (ref 150–400)
RBC: 3.24 MIL/uL — ABNORMAL LOW (ref 4.22–5.81)
RDW: 17.7 % — ABNORMAL HIGH (ref 11.5–15.5)
WBC: 13.7 10*3/uL — ABNORMAL HIGH (ref 4.0–10.5)
nRBC: 0 % (ref 0.0–0.2)

## 2021-10-25 LAB — URINE CULTURE: Culture: >=100000 — AB

## 2021-10-25 LAB — MAGNESIUM: Magnesium: 1.7 mg/dL (ref 1.7–2.4)

## 2021-10-26 LAB — BASIC METABOLIC PANEL
Anion gap: 8 (ref 5–15)
BUN: 86 mg/dL — ABNORMAL HIGH (ref 8–23)
CO2: 19 mmol/L — ABNORMAL LOW (ref 22–32)
Calcium: 8.3 mg/dL — ABNORMAL LOW (ref 8.9–10.3)
Chloride: 113 mmol/L — ABNORMAL HIGH (ref 98–111)
Creatinine, Ser: 2.16 mg/dL — ABNORMAL HIGH (ref 0.61–1.24)
GFR, Estimated: 29 mL/min — ABNORMAL LOW (ref >60)
Glucose, Bld: 123 mg/dL — ABNORMAL HIGH (ref 70–99)
Potassium: 3.6 mmol/L (ref 3.5–5.1)
Sodium: 140 mmol/L (ref 135–145)

## 2021-10-26 LAB — MAGNESIUM: Magnesium: 2.2 mg/dL (ref 1.7–2.4)

## 2021-10-27 ENCOUNTER — Other Ambulatory Visit (HOSPITAL_COMMUNITY): Payer: Medicare Other

## 2021-10-27 LAB — RENAL FUNCTION PANEL
Albumin: <1.5 g/dL — ABNORMAL LOW (ref 3.5–5.0)
Anion gap: 7 (ref 5–15)
BUN: 82 mg/dL — ABNORMAL HIGH (ref 8–23)
CO2: 22 mmol/L (ref 22–32)
Calcium: 8 mg/dL — ABNORMAL LOW (ref 8.9–10.3)
Chloride: 110 mmol/L (ref 98–111)
Creatinine, Ser: 2.14 mg/dL — ABNORMAL HIGH (ref 0.61–1.24)
GFR, Estimated: 29 mL/min — ABNORMAL LOW (ref >60)
Glucose, Bld: 70 mg/dL (ref 70–99)
Phosphorus: 3.8 mg/dL (ref 2.5–4.6)
Potassium: 2.5 mmol/L — CL (ref 3.5–5.1)
Sodium: 139 mmol/L (ref 135–145)

## 2021-10-28 ENCOUNTER — Other Ambulatory Visit (HOSPITAL_COMMUNITY): Payer: Medicare Other

## 2021-10-28 LAB — CBC
HCT: 23.8 % — ABNORMAL LOW (ref 39.0–52.0)
HCT: 28 % — ABNORMAL LOW (ref 39.0–52.0)
Hemoglobin: 7.1 g/dL — ABNORMAL LOW (ref 13.0–17.0)
Hemoglobin: 8.3 g/dL — ABNORMAL LOW (ref 13.0–17.0)
MCH: 25.4 pg — ABNORMAL LOW (ref 26.0–34.0)
MCH: 25.7 pg — ABNORMAL LOW (ref 26.0–34.0)
MCHC: 29.8 g/dL — ABNORMAL LOW (ref 30.0–36.0)
MCHC: 29.6 g/dL — ABNORMAL LOW (ref 30.0–36.0)
MCV: 85 fL (ref 80.0–100.0)
MCV: 86.7 fL (ref 80.0–100.0)
Platelets: 219 10*3/uL (ref 150–400)
Platelets: 139 10*3/uL — ABNORMAL LOW (ref 150–400)
RBC: 2.8 MIL/uL — ABNORMAL LOW (ref 4.22–5.81)
RBC: 3.23 MIL/uL — ABNORMAL LOW (ref 4.22–5.81)
RDW: 18.1 % — ABNORMAL HIGH (ref 11.5–15.5)
RDW: 17.3 % — ABNORMAL HIGH (ref 11.5–15.5)
WBC: 8.1 10*3/uL (ref 4.0–10.5)
WBC: 6.4 10*3/uL (ref 4.0–10.5)
nRBC: 0 % (ref 0.0–0.2)
nRBC: 0 % (ref 0.0–0.2)

## 2021-10-28 LAB — BASIC METABOLIC PANEL
Anion gap: 7 (ref 5–15)
BUN: 72 mg/dL — ABNORMAL HIGH (ref 8–23)
CO2: 22 mmol/L (ref 22–32)
Calcium: 7.8 mg/dL — ABNORMAL LOW (ref 8.9–10.3)
Chloride: 111 mmol/L (ref 98–111)
Creatinine, Ser: 2.07 mg/dL — ABNORMAL HIGH (ref 0.61–1.24)
GFR, Estimated: 30 mL/min — ABNORMAL LOW (ref >60)
Glucose, Bld: 108 mg/dL — ABNORMAL HIGH (ref 70–99)
Potassium: 3.1 mmol/L — ABNORMAL LOW (ref 3.5–5.1)
Sodium: 140 mmol/L (ref 135–145)

## 2021-10-28 LAB — MAGNESIUM: Magnesium: 1.9 mg/dL (ref 1.7–2.4)

## 2021-10-28 LAB — PREPARE RBC (CROSSMATCH)

## 2021-10-29 ENCOUNTER — Other Ambulatory Visit (HOSPITAL_COMMUNITY): Payer: Medicare Other

## 2021-10-29 HISTORY — PX: IR THORACENTESIS ASP PLEURAL SPACE W/IMG GUIDE: IMG5380

## 2021-10-29 LAB — CBC
HCT: 28.9 % — ABNORMAL LOW (ref 39.0–52.0)
HCT: 26.9 % — ABNORMAL LOW (ref 39.0–52.0)
Hemoglobin: 8.5 g/dL — ABNORMAL LOW (ref 13.0–17.0)
Hemoglobin: 8 g/dL — ABNORMAL LOW (ref 13.0–17.0)
MCH: 25.6 pg — ABNORMAL LOW (ref 26.0–34.0)
MCH: 25.4 pg — ABNORMAL LOW (ref 26.0–34.0)
MCHC: 29.4 g/dL — ABNORMAL LOW (ref 30.0–36.0)
MCHC: 29.7 g/dL — ABNORMAL LOW (ref 30.0–36.0)
MCV: 87 fL (ref 80.0–100.0)
MCV: 85.4 fL (ref 80.0–100.0)
Platelets: 204 10*3/uL (ref 150–400)
Platelets: 211 10*3/uL (ref 150–400)
RBC: 3.32 MIL/uL — ABNORMAL LOW (ref 4.22–5.81)
RBC: 3.15 MIL/uL — ABNORMAL LOW (ref 4.22–5.81)
RDW: 17.3 % — ABNORMAL HIGH (ref 11.5–15.5)
RDW: 17.3 % — ABNORMAL HIGH (ref 11.5–15.5)
WBC: 7.2 10*3/uL (ref 4.0–10.5)
WBC: 6.3 10*3/uL (ref 4.0–10.5)
nRBC: 0 % (ref 0.0–0.2)
nRBC: 0 % (ref 0.0–0.2)

## 2021-10-29 LAB — AMYLASE, PLEURAL OR PERITONEAL FLUID: Amylase, Fluid: 8 U/L

## 2021-10-29 LAB — TYPE AND SCREEN
ABO/RH(D): O POS
Antibody Screen: NEGATIVE
Unit division: 0

## 2021-10-29 LAB — BPAM RBC
Blood Product Expiration Date: 202212282359
ISSUE DATE / TIME: 202212011512
Unit Type and Rh: 5100

## 2021-10-29 LAB — LACTATE DEHYDROGENASE, PLEURAL OR PERITONEAL FLUID: LD, Fluid: 133 U/L — ABNORMAL HIGH (ref 3–23)

## 2021-10-29 LAB — POTASSIUM: Potassium: 3 mmol/L — ABNORMAL LOW (ref 3.5–5.1)

## 2021-10-29 LAB — GLUCOSE, PLEURAL OR PERITONEAL FLUID: Glucose, Fluid: 168 mg/dL

## 2021-10-29 LAB — PROTEIN, PLEURAL OR PERITONEAL FLUID: Total protein, fluid: <3 g/dL

## 2021-10-29 MED ORDER — LIDOCAINE HCL 1 % IJ SOLN
INTRAMUSCULAR | Status: AC
  Administered 2021-10-29: 10 mL
  Filled 2021-10-29: qty 20

## 2021-10-29 NOTE — Progress Notes (Signed)
Brief progress note:  Pt experienced right apical pneumothorax, involving approximately 25% of the right chest during thoracentesis procedure today.  He clinically remained stable for 1 hr post procedure in the department.  HR 65, RR 24, BP 137/77.  Discussions were had with both family (wife and daughter) as well as Priya at [facility] [employer].    Plan is to return to unit at [facility], watch him overnight, and get a 6am CXR presuming he remains stable.  If pneumothorax enlarges or is otherwise clinically indicated, a chest tube can be placed, either overnight or tomorrow.    Electronically Signed: Sheliah Plane, PA 10/29/2021, 6:10 PM

## 2021-10-29 NOTE — Procedures (Signed)
PROCEDURE SUMMARY:  US guided right thoracentesis. Yielded approximately 50cc of straw colored fluid. Pt tolerated procedure well.  Immediately upon aspiration of fluid for labs, it was found there was air and fluid being drawn into the syringe.  The catheter was removed and chest xray ordered.  Specimen was sent for labs.  EBL negligible  Antero Derosia PA-C 10/29/2021 5:09 PM

## 2021-10-30 ENCOUNTER — Other Ambulatory Visit (HOSPITAL_COMMUNITY): Payer: Medicare Other

## 2021-10-30 HISTORY — PX: IR PERC PLEURAL DRAIN W/INDWELL CATH W/IMG GUIDE: IMG5383

## 2021-10-30 LAB — RENAL FUNCTION PANEL
Albumin: 1.7 g/dL — ABNORMAL LOW (ref 3.5–5.0)
Anion gap: 8 (ref 5–15)
BUN: 54 mg/dL — ABNORMAL HIGH (ref 8–23)
CO2: 20 mmol/L — ABNORMAL LOW (ref 22–32)
Calcium: 8 mg/dL — ABNORMAL LOW (ref 8.9–10.3)
Chloride: 111 mmol/L (ref 98–111)
Creatinine, Ser: 1.85 mg/dL — ABNORMAL HIGH (ref 0.61–1.24)
GFR, Estimated: 35 mL/min — ABNORMAL LOW (ref >60)
Glucose, Bld: 119 mg/dL — ABNORMAL HIGH (ref 70–99)
Phosphorus: 3.8 mg/dL (ref 2.5–4.6)
Potassium: 3.7 mmol/L (ref 3.5–5.1)
Sodium: 139 mmol/L (ref 135–145)

## 2021-10-30 LAB — CBC
HCT: 29.2 % — ABNORMAL LOW (ref 39.0–52.0)
Hemoglobin: 8.9 g/dL — ABNORMAL LOW (ref 13.0–17.0)
MCH: 25.8 pg — ABNORMAL LOW (ref 26.0–34.0)
MCHC: 30.5 g/dL (ref 30.0–36.0)
MCV: 84.6 fL (ref 80.0–100.0)
Platelets: 210 10*3/uL (ref 150–400)
RBC: 3.45 MIL/uL — ABNORMAL LOW (ref 4.22–5.81)
RDW: 17.2 % — ABNORMAL HIGH (ref 11.5–15.5)
WBC: 7 10*3/uL (ref 4.0–10.5)
nRBC: 0 % (ref 0.0–0.2)

## 2021-10-30 LAB — GRAM STAIN

## 2021-10-30 LAB — MAGNESIUM: Magnesium: 1.8 mg/dL (ref 1.7–2.4)

## 2021-10-30 MED ORDER — LIDOCAINE HCL 1 % IJ SOLN
INTRAMUSCULAR | Status: AC
  Filled 2021-10-30: qty 20

## 2021-10-30 NOTE — Procedures (Signed)
Interventional Radiology Procedure Note  Procedure: Placement of a 10.58F chest tube on the right.   Complications: None  Estimated Blood Loss: None  Recommendations: - Tube to LWS at -20 cm H2O - IR to follow    Signed,  Sterling Big, MD

## 2021-10-31 ENCOUNTER — Other Ambulatory Visit (HOSPITAL_COMMUNITY): Payer: Medicare Other

## 2021-10-31 LAB — RENAL FUNCTION PANEL
Albumin: 1.6 g/dL — ABNORMAL LOW (ref 3.5–5.0)
Anion gap: 9 (ref 5–15)
BUN: 44 mg/dL — ABNORMAL HIGH (ref 8–23)
CO2: 22 mmol/L (ref 22–32)
Calcium: 7.7 mg/dL — ABNORMAL LOW (ref 8.9–10.3)
Chloride: 107 mmol/L (ref 98–111)
Creatinine, Ser: 1.77 mg/dL — ABNORMAL HIGH (ref 0.61–1.24)
GFR, Estimated: 36 mL/min — ABNORMAL LOW (ref >60)
Glucose, Bld: 157 mg/dL — ABNORMAL HIGH (ref 70–99)
Phosphorus: 3.4 mg/dL (ref 2.5–4.6)
Potassium: 2.9 mmol/L — ABNORMAL LOW (ref 3.5–5.1)
Sodium: 138 mmol/L (ref 135–145)

## 2021-10-31 LAB — POTASSIUM: Potassium: 4.1 mmol/L (ref 3.5–5.1)

## 2021-11-01 ENCOUNTER — Other Ambulatory Visit (HOSPITAL_COMMUNITY): Payer: Medicare Other

## 2021-11-01 LAB — BASIC METABOLIC PANEL
Anion gap: 6 (ref 5–15)
BUN: 34 mg/dL — ABNORMAL HIGH (ref 8–23)
CO2: 20 mmol/L — ABNORMAL LOW (ref 22–32)
Calcium: 7.7 mg/dL — ABNORMAL LOW (ref 8.9–10.3)
Chloride: 109 mmol/L (ref 98–111)
Creatinine, Ser: 1.52 mg/dL — ABNORMAL HIGH (ref 0.61–1.24)
GFR, Estimated: 44 mL/min — ABNORMAL LOW (ref >60)
Glucose, Bld: 156 mg/dL — ABNORMAL HIGH (ref 70–99)
Potassium: 3.4 mmol/L — ABNORMAL LOW (ref 3.5–5.1)
Sodium: 135 mmol/L (ref 135–145)

## 2021-11-01 LAB — CBC
HCT: 29.4 % — ABNORMAL LOW (ref 39.0–52.0)
Hemoglobin: 8.8 g/dL — ABNORMAL LOW (ref 13.0–17.0)
MCH: 25.1 pg — ABNORMAL LOW (ref 26.0–34.0)
MCHC: 29.9 g/dL — ABNORMAL LOW (ref 30.0–36.0)
MCV: 84 fL (ref 80.0–100.0)
Platelets: 219 10*3/uL (ref 150–400)
RBC: 3.5 MIL/uL — ABNORMAL LOW (ref 4.22–5.81)
RDW: 17.9 % — ABNORMAL HIGH (ref 11.5–15.5)
WBC: 7.1 10*3/uL (ref 4.0–10.5)
nRBC: 0.3 % — ABNORMAL HIGH (ref 0.0–0.2)

## 2021-11-01 LAB — MAGNESIUM: Magnesium: 1.7 mg/dL (ref 1.7–2.4)

## 2021-11-01 NOTE — Progress Notes (Signed)
Referring Physician(s): Janora Norlander NP   Supervising Physician: Ruthann Cancer  Patient Status:  [facility] - In-pt  Chief Complaint:  S/p right CT placement on 12/3   Subjective:  Laying in bed, NAD.  Unable to answer question, ROS not obtained.   Allergies: Patient has no allergy information on record.  Medications: Prior to Admission medications   Not on File     Vital Signs: BP (!) 138/59   Resp (!) 22   SpO2 99%   Physical Exam Vitals reviewed.  Constitutional:      General: He is not in acute distress.    Appearance: He is ill-appearing.  HENT:     Head: Normocephalic and atraumatic.  Pulmonary:     Effort: Pulmonary effort is normal.  Skin:    General: Skin is warm and dry.     Coloration: Skin is not pale.     Comments: Positive right chest tube to Pleur-evac. Site is unremarkable with no erythema, edema, tenderness, bleeding or drainage. Suture and stat lock in place. Dressing is clean, dry, and intact. 1,400 ml of  yellow colored fluid noted in the Pleur-evac.     Imaging: CT ABDOMEN PELVIS WO CONTRAST  Result Date: 10/29/2021 CLINICAL DATA:  Passing blood clots within the stool. EXAM: CT ABDOMEN AND PELVIS WITHOUT CONTRAST TECHNIQUE: Multidetector CT imaging of the abdomen and pelvis was performed following the standard protocol without IV contrast. COMPARISON:  None. FINDINGS: Lower chest: Marked severity areas of consolidation are seen within the bilateral lung bases. There is a moderate size right pleural effusion. A small left pleural effusion is also noted. Hepatobiliary: No focal liver abnormality is seen. Numerous subcentimeter gallstones are seen within lumen of an otherwise normal-appearing gallbladder. Pancreas: Unremarkable. No pancreatic ductal dilatation or surrounding inflammatory changes. Spleen: Normal in size without focal abnormality. Adrenals/Urinary Tract: Adrenal glands are unremarkable. Kidneys are normal, without renal calculi, focal  lesion, or hydronephrosis. A percutaneous catheter is seen with its distal end noted within the anterior aspect of a predominant collapsed urinary bladder. Moderate severity diffuse urinary bladder wall thickening is also seen. Stomach/Bowel: A percutaneous gastrostomy tube is seen with its distal tip and insufflator bulb noted within the body of the stomach. Appendix appears normal. No evidence of bowel dilatation. Marked severity diffuse colonic wall thickening is seen with associated pericolonic inflammatory fat stranding. Vascular/Lymphatic: Aortic atherosclerosis. No enlarged abdominal or pelvic lymph nodes. Reproductive: Mild calcification of the prostate gland is seen. Other: Moderate to marked severity anasarca is noted within the lateral and posterior pelvic walls. There is a 3.0 cm x 2.4 cm left inguinal hernia. This contains a short segment of normal-appearing small bowel. Moderate severity presacral inflammatory fat stranding is noted. No abdominopelvic ascites. Musculoskeletal: Mild to moderate severity soft tissue thickening and edema are seen posterior to the lower sacrum and coccyx. There is no evidence of associated cortical destruction. A fracture of indeterminate age is seen extending through the left femoral head with superior displacement of the distal fracture site. A chronic compression fracture deformity is seen at the level of L2. Marked severity multilevel degenerative changes are noted throughout the lumbar spine. IMPRESSION: 1. Marked severity diffuse colitis. 2. Marked severity bibasilar consolidation with bilateral pleural effusions, right greater than left. 3. Cholelithiasis. 4. Urinary bladder wall thickening which may represent sequelae associated with cystitis. Correlation with urinalysis is recommended. 5. Left femoral head fracture of indeterminate age. Electronically Signed   By: Virgina Norfolk M.D.   On:  10/29/2021 02:17   DG Chest Port 1 View  Result Date:  11/01/2021 CLINICAL DATA:  Follow-up pneumothorax. EXAM: PORTABLE CHEST 1 VIEW COMPARISON:  10/31/2021 FINDINGS: Right sided chest tube is again noted in appears stable in position compared with the previous exam. The right-sided pneumothorax measures 1.4 cm in thickness as measured over the right upper lobe. This is unchanged from previous exam. Bilateral pulmonary opacities are unchanged in the interval. IMPRESSION: 1. No change in right-sided pneumothorax. Stable position of right sided chest tube. 2. Unchanged bilateral pulmonary opacities. Electronically Signed   By: Signa Kell M.D.   On: 11/01/2021 06:53   DG CHEST PORT 1 VIEW  Result Date: 10/31/2021 CLINICAL DATA:  Pneumothorax. EXAM: PORTABLE CHEST 1 VIEW COMPARISON:  10/30/2021 FINDINGS: 0637 hours. Right-sided pneumothorax seen previously has decreased slightly in the interval. Right pleural drain remains in place. Persistent basilar opacity on the left likely atelectasis and/or layering pleural fluid. Cardiopericardial silhouette is at upper limits of normal for size. Bones are diffusely demineralized. Telemetry leads overlie the chest. IMPRESSION: 1. Slight interval decrease in right pneumothorax. 2. Otherwise no substantial change. Electronically Signed   By: Kennith Center M.D.   On: 10/31/2021 07:40   DG Chest Port 1 View  Result Date: 10/30/2021 CLINICAL DATA:  Chest tube placement. EXAM: PORTABLE CHEST 1 VIEW COMPARISON:  Chest x-ray from earlier same day. FINDINGS: RIGHT-sided chest tube is been placed with tip directed towards the RIGHT hilum. Persistent pneumothorax, moderate in size. Stable opacity at the LEFT lung base, likely atelectasis and/or small pleural effusion. Heart size and mediastinal contours are grossly stable. IMPRESSION: 1. Interval RIGHT-sided chest tube placement. Persistent pneumothorax, moderate in size. 2. Stable opacity at the LEFT lung base, likely atelectasis and/or small pleural effusion. Electronically  Signed   By: Bary Richard M.D.   On: 10/30/2021 11:03   DG Chest Port 1 View  Result Date: 10/30/2021 CLINICAL DATA:  87 year old male with shortness of breath. Ultrasound-guided right side thoracentesis yesterday with small to moderate postprocedural pneumothorax. EXAM: PORTABLE CHEST 1 VIEW COMPARISON:  Portable chest 10/29/2021 and earlier. FINDINGS: Portable AP semi upright view at 0653 hours. Right pneumothorax has increased since yesterday and is moderate, with pleural edge visible along the entire right lateral chest wall. Superimposed veiling opacity in both lung bases compatible with small to moderate bilateral pleural effusions. Stable cardiac size and mediastinal contours. Calcified aortic atherosclerosis. Visualized tracheal air column is within normal limits. Increased pulmonary vascularity. No air bronchograms. No acute osseous abnormality identified. IMPRESSION: 1. Increasing and moderate right pneumothorax following thoracentesis yesterday. Recommend consultation regarding possible chest tube placement. 2. Increased pulmonary vascularity suggesting mild acute interstitial edema. 3. Ongoing bilateral pleural effusions. Electronically Signed   By: Odessa Fleming M.D.   On: 10/30/2021 08:04   DG Chest Port 1 View  Result Date: 10/29/2021 CLINICAL DATA:  Post right thoracentesis EXAM: PORTABLE CHEST 1 VIEW COMPARISON:  10/23/2021 FINDINGS: Right apical pneumothorax approximately 2 cm in thickness. There is associated right pleural effusion and right lower lobe atelectasis similar to the prior study. Left lower lobe airspace disease and small left effusion unchanged. Possible mild pulmonary edema. IMPRESSION: 2 cm right apical pneumothorax post right thoracentesis Bilateral airspace disease and bilateral effusions. Possible heart failure. Call report in progress. Electronically Signed   By: Marlan Palau M.D.   On: 10/29/2021 17:45   DG Abd Portable 1V  Result Date: 10/29/2021 CLINICAL DATA:   Ileus. EXAM: PORTABLE ABDOMEN - 1 VIEW COMPARISON:  10/28/2021 FINDINGS: No gaseous bowel dilatation to suggest obstruction. No gaseous colonic distension. Diffuse degenerative changes noted lumbar spine. Gastrostomy tube evident. IMPRESSION: Nonobstructive bowel gas pattern. Electronically Signed   By: Misty Stanley M.D.   On: 10/29/2021 06:44   IR THORACENTESIS ASP PLEURAL SPACE W/IMG GUIDE  Result Date: 10/29/2021 INDICATION: Pleural effusion, SOB EXAM: ULTRASOUND GUIDED RIGHT THORACENTESIS MEDICATIONS: None. COMPLICATIONS: Air and pleural fluid were expressed from pleural cavity immediately upon access. Catheter removed and procedure terminated. Chest x-ray ordered PROCEDURE: An ultrasound guided thoracentesis was thoroughly discussed with the patient's wife and questions answered. The benefits, risks, alternatives and complications were also discussed. The wife understands and wishes to proceed with the procedure. Verbal consent by phone was obtained. Ultrasound was performed to localize and mark an adequate pocket of fluid in the right chest. The area was then prepped and draped in the normal sterile fashion. 1% Lidocaine was used for local anesthesia. A 6 Fr Safe-T-Centesis catheter was introduced. Thoracentesis was performed. The catheter was removed and a dressing applied. FINDINGS: A total of approximately 50cc of yellow fluid was removed. Suspected a pneumothorax as air was also being aspirated. IMPRESSION: Ultrasound guided right thoracentesis yielding only 50cc of pleural fluid and complicated by pneumothorax. Notified immediately Attending physician Markus Daft, MD who took over care. Read and performed by: Alexandria Lodge, PA-C Electronically Signed   By: Markus Daft M.D.   On: 10/29/2021 18:20   IR PERC PLEURAL DRAIN W/INDWELL CATH W/IMG GUIDE  Result Date: 10/30/2021 INDICATION: 87 year old male with enlarging pneumothorax following right-sided thoracentesis. He presents for placement of a  chest tube. EXAM: Ultrasound and fluoroscopic guided placement of right-sided chest tube MEDICATIONS: None. ANESTHESIA/SEDATION: None. COMPLICATIONS: None immediate. PROCEDURE: Informed written consent was obtained from the patient after a thorough discussion of the procedural risks, benefits and alternatives. All questions were addressed. Maximal Sterile Barrier Technique was utilized including caps, mask, sterile gowns, sterile gloves, sterile drape, hand hygiene and skin antiseptic. A timeout was performed prior to the initiation of the procedure. The patient has significant contractures. Initial imaging with fluoroscopy was performed, however the patient's arm drapes over the chest. A suitable entry point for chest tube placement could not be identified. Therefore, the right lateral chest wall was interrogated with ultrasound. There is residual pleural fluid at the right lung base which allows for safe placement of a percutaneous thoracostomy tube. The overlying skin was sterilely prepped and draped in the standard fashion using chlorhexidine skin prep. Local anesthesia was attained by infiltration with 1% lidocaine. A small dermatotomy was made. Under real-time ultrasound guidance, an 18 gauge trocar needle was carefully advanced into the pleural space avoiding the atelectatic lung. Images were obtained and stored for the permanent medical record. A 0.035 wire was then advanced into the right pleural space to the lung apex. A 10.2 [place] all-purpose drainage catheter was modified with additional sideholes and advanced over the wire into the right pleural space. There was immediate return of pleural fluid. The catheter was secured to the skin with 0 Prolene suture. An air tight bandage was applied. The patient tolerated the procedure well. IMPRESSION: Successful placement of 10.2 French right-sided chest tube with evacuation of pleural fluid and some air. Post placement chest x-ray demonstrates complete  resolution of basilar pleural fluid and decreasing pneumothorax. Electronically Signed   By: Jacqulynn Cadet M.D.   On: 10/30/2021 12:11    Labs:  CBC: Recent Labs    10/29/21 0422 10/29/21 1451 10/30/21  [id number] 11/01/21 0319  WBC 7.2 6.3 7.0 7.1  HGB 8.5* 8.0* 8.9* 8.8*  HCT 28.9* 26.9* 29.2* 29.4*  PLT 204 211 210 219    COAGS: Recent Labs    09/22/21 0427  INR 1.4*    BMP: Recent Labs    10/28/21 0214 10/29/21 0422 10/30/21 0358 10/31/21 0447 10/31/21 2146 11/01/21 0319  NA 140  --  139 138  --  135  K 3.1*   < > 3.7 2.9* 4.1 3.4*  CL 111  --  111 107  --  109  CO2 22  --  20* 22  --  20*  GLUCOSE 108*  --  119* 157*  --  156*  BUN 72*  --  54* 44*  --  34*  CALCIUM 7.8*  --  8.0* 7.7*  --  7.7*  CREATININE 2.07*  --  1.85* 1.77*  --  1.52*  GFRNONAA 30*  --  35* 36*  --  44*   < > = values in this interval not displayed.    LIVER FUNCTION TESTS: Recent Labs    09/22/21 0427 09/23/21 0358 10/25/21 0419 10/27/21 0419 10/30/21 0358 10/31/21 0447  BILITOT 0.6  --   --   --   --   --   AST 20  --   --   --   --   --   ALT 16  --   --   --   --   --   ALKPHOS 60  --   --   --   --   --   PROT 5.8*  --   --   --   --   --   ALBUMIN 1.7*   < > 1.5* <1.5* 1.7* 1.6*   < > = values in this interval not displayed.    Assessment and Plan:  87 y.o. male with right PTX post thoracentesis, s/p right chest tube placement on 12/3.  VS O2 sat 100%, tachypnea   1,400 cc in the Pleur-evac, CT LWS.   CXR today showed no change in right PTX with stable chest tube position.   PLAN  Continue right CT to [facility] and repeat CXR daily  Will consider water seal trial when appropriate   Further treatment plan per [facility] Appreciate and agree with the plan.  IR to follow.    Electronically Signed: Tera Mater, PA-C 11/01/2021, 2:18 PM   I spent a total of 15 Minutes at the the patient's bedside AND on the patient's hospital floor or unit, greater than 50% of which was  counseling/coordinating care for right CT.   This chart was dictated using voice recognition software.  Despite best efforts to proofread,  errors can occur which can change the documentation meaning.

## 2021-11-02 LAB — RENAL FUNCTION PANEL
Albumin: 1.6 g/dL — ABNORMAL LOW (ref 3.5–5.0)
Anion gap: 7 (ref 5–15)
BUN: 33 mg/dL — ABNORMAL HIGH (ref 8–23)
CO2: 20 mmol/L — ABNORMAL LOW (ref 22–32)
Calcium: 7.6 mg/dL — ABNORMAL LOW (ref 8.9–10.3)
Chloride: 107 mmol/L (ref 98–111)
Creatinine, Ser: 1.4 mg/dL — ABNORMAL HIGH (ref 0.61–1.24)
GFR, Estimated: 48 mL/min — ABNORMAL LOW (ref >60)
Glucose, Bld: 118 mg/dL — ABNORMAL HIGH (ref 70–99)
Phosphorus: 1.8 mg/dL — ABNORMAL LOW (ref 2.5–4.6)
Potassium: 3.6 mmol/L (ref 3.5–5.1)
Sodium: 134 mmol/L — ABNORMAL LOW (ref 135–145)

## 2021-11-02 LAB — MAGNESIUM: Magnesium: 2.1 mg/dL (ref 1.7–2.4)

## 2021-11-03 ENCOUNTER — Other Ambulatory Visit (HOSPITAL_COMMUNITY): Payer: Medicare Other

## 2021-11-03 LAB — CBC
HCT: 28.8 % — ABNORMAL LOW (ref 39.0–52.0)
Hemoglobin: 8.6 g/dL — ABNORMAL LOW (ref 13.0–17.0)
MCH: 25.2 pg — ABNORMAL LOW (ref 26.0–34.0)
MCHC: 29.9 g/dL — ABNORMAL LOW (ref 30.0–36.0)
MCV: 84.5 fL (ref 80.0–100.0)
Platelets: 180 10*3/uL (ref 150–400)
RBC: 3.41 MIL/uL — ABNORMAL LOW (ref 4.22–5.81)
RDW: 18.4 % — ABNORMAL HIGH (ref 11.5–15.5)
WBC: 8.3 10*3/uL (ref 4.0–10.5)
nRBC: 0 % (ref 0.0–0.2)

## 2021-11-03 LAB — RENAL FUNCTION PANEL
Albumin: 1.5 g/dL — ABNORMAL LOW (ref 3.5–5.0)
Anion gap: 8 (ref 5–15)
BUN: 32 mg/dL — ABNORMAL HIGH (ref 8–23)
CO2: 21 mmol/L — ABNORMAL LOW (ref 22–32)
Calcium: 7.5 mg/dL — ABNORMAL LOW (ref 8.9–10.3)
Chloride: 106 mmol/L (ref 98–111)
Creatinine, Ser: 1.44 mg/dL — ABNORMAL HIGH (ref 0.61–1.24)
GFR, Estimated: 47 mL/min — ABNORMAL LOW (ref >60)
Glucose, Bld: 162 mg/dL — ABNORMAL HIGH (ref 70–99)
Phosphorus: 1.8 mg/dL — ABNORMAL LOW (ref 2.5–4.6)
Potassium: 3.2 mmol/L — ABNORMAL LOW (ref 3.5–5.1)
Sodium: 135 mmol/L (ref 135–145)

## 2021-11-03 LAB — LACTIC ACID, PLASMA: Lactic Acid, Venous: 1.4 mmol/L (ref 0.5–1.9)

## 2021-11-03 LAB — MAGNESIUM: Magnesium: 2 mg/dL (ref 1.7–2.4)

## 2021-11-03 LAB — CULTURE, BODY FLUID W GRAM STAIN -BOTTLE: Culture: NO GROWTH

## 2021-11-03 NOTE — Progress Notes (Signed)
Referring Physician(s): Camelia Eng NP   Supervising Physician: Irish Lack  Patient Status:  [facility] - In-pt  Chief Complaint:  Iatrogenic pneumothorax  Brief History:  Mr. Stephen Cobb is an 87 year man with long term vent dependence secondary to respiratory failure due to sepsis with development of pleural effusion.  He underwent thoracentesis on 12/2 and developed a pneumothorax.  He underwent chest tube placement by Dr. Archer Asa on 12/3 for enlarging hydropneumothorax.  Chest Xray today = Small right-sided hydropneumothorax is grossly stable in size. Right-sided chest tube is stable in position.  Allergies: Patient has no allergy information on record.  Medications: Prior to Admission medications   Not on File     Vital Signs: BP (!) 138/59   Resp (!) 22   SpO2 99%   Physical Exam Constitutional:      Appearance: He is ill-appearing.  Chest tube in place ~1800 mL output since placement (150 mL last 24 hours)  Imaging: DG Chest Port 1 View  Result Date: 11/03/2021 CLINICAL DATA:  87 year old male with history of right-sided pneumothorax. EXAM: PORTABLE CHEST 1 VIEW COMPARISON:  Chest x-ray 11/01/2021. FINDINGS: Small bore right-sided chest tube remains in position with pigtail reformed in the medial aspect of the mid right hemithorax. Small right sided pneumothorax occupying approximately 10% of the volume of the right hemithorax is stable in size. Patchy multifocal interstitial and airspace disease noted throughout the lungs bilaterally. More confluent opacity at the left base may reflect additional areas of atelectasis and/or consolidation. Small bilateral pleural effusions. No left pneumothorax. Pulmonary vasculature appears engorged. Heart size is mildly enlarged. Upper mediastinal contours are within normal limits. Atherosclerotic calcifications are noted in the thoracic aorta. IMPRESSION: 1. Small right-sided hydropneumothorax is grossly stable in size.  Right-sided chest tube is stable in position. 2. Persistent multifocal interstitial and airspace disease in the lungs bilaterally, suspicious for pulmonary edema in the setting of congestive heart failure, although multilobar pneumonia is not excluded. 3. Small left pleural effusion unchanged. 4. Aortic atherosclerosis. Electronically Signed   By: Trudie Reed M.D.   On: 11/03/2021 05:50   DG Chest Port 1 View  Result Date: 11/01/2021 CLINICAL DATA:  Follow-up pneumothorax. EXAM: PORTABLE CHEST 1 VIEW COMPARISON:  10/31/2021 FINDINGS: Right sided chest tube is again noted in appears stable in position compared with the previous exam. The right-sided pneumothorax measures 1.4 cm in thickness as measured over the right upper lobe. This is unchanged from previous exam. Bilateral pulmonary opacities are unchanged in the interval. IMPRESSION: 1. No change in right-sided pneumothorax. Stable position of right sided chest tube. 2. Unchanged bilateral pulmonary opacities. Electronically Signed   By: Signa Kell M.D.   On: 11/01/2021 06:53   DG CHEST PORT 1 VIEW  Result Date: 10/31/2021 CLINICAL DATA:  Pneumothorax. EXAM: PORTABLE CHEST 1 VIEW COMPARISON:  10/30/2021 FINDINGS: 0637 hours. Right-sided pneumothorax seen previously has decreased slightly in the interval. Right pleural drain remains in place. Persistent basilar opacity on the left likely atelectasis and/or layering pleural fluid. Cardiopericardial silhouette is at upper limits of normal for size. Bones are diffusely demineralized. Telemetry leads overlie the chest. IMPRESSION: 1. Slight interval decrease in right pneumothorax. 2. Otherwise no substantial change. Electronically Signed   By: Kennith Center M.D.   On: 10/31/2021 07:40    Labs:  CBC: Recent Labs    10/29/21 1451 10/30/21 0358 11/01/21 0319 11/03/21 0329  WBC 6.3 7.0 7.1 8.3  HGB 8.0* 8.9* 8.8* 8.6*  HCT 26.9* 29.2*  29.4* 28.8*  PLT 211 210 219 180    COAGS: Recent  Labs    09/22/21 0427  INR 1.4*    BMP: Recent Labs    10/31/21 0447 10/31/21 2146 11/01/21 0319 11/02/21 0355 11/03/21 0329  NA 138  --  135 134* 135  K 2.9* 4.1 3.4* 3.6 3.2*  CL 107  --  109 107 106  CO2 22  --  20* 20* 21*  GLUCOSE 157*  --  156* 118* 162*  BUN 44*  --  34* 33* 32*  CALCIUM 7.7*  --  7.7* 7.6* 7.5*  CREATININE 1.77*  --  1.52* 1.40* 1.44*  GFRNONAA 36*  --  44* 48* 47*    LIVER FUNCTION TESTS: Recent Labs    09/22/21 0427 09/23/21 0358 10/30/21 0358 10/31/21 0447 11/02/21 0355 11/03/21 0329  BILITOT 0.6  --   --   --   --   --   AST 20  --   --   --   --   --   ALT 16  --   --   --   --   --   ALKPHOS 60  --   --   --   --   --   PROT 5.8*  --   --   --   --   --   ALBUMIN 1.7*   < > 1.7* 1.6* 1.6* 1.5*   < > = values in this interval not displayed.    Assessment and Plan:  Images reviewed by Dr. Fredia Sorrow  Will put to water seal today.  Repeat CXR in am.  Electronically Signed: Gwynneth Macleod, PA-C 11/03/2021, 2:24 PM    I spent a total of 15 Minutes at the the patient's bedside AND on the patient's hospital floor or unit, greater than 50% of which was counseling/coordinating care for f/u chest tube.

## 2021-11-04 ENCOUNTER — Other Ambulatory Visit (HOSPITAL_COMMUNITY): Payer: Medicare Other

## 2021-11-04 LAB — RENAL FUNCTION PANEL
Albumin: 1.5 g/dL — ABNORMAL LOW (ref 3.5–5.0)
Anion gap: 5 (ref 5–15)
BUN: 33 mg/dL — ABNORMAL HIGH (ref 8–23)
CO2: 19 mmol/L — ABNORMAL LOW (ref 22–32)
Calcium: 7.5 mg/dL — ABNORMAL LOW (ref 8.9–10.3)
Chloride: 109 mmol/L (ref 98–111)
Creatinine, Ser: 1.39 mg/dL — ABNORMAL HIGH (ref 0.61–1.24)
GFR, Estimated: 49 mL/min — ABNORMAL LOW (ref >60)
Glucose, Bld: 150 mg/dL — ABNORMAL HIGH (ref 70–99)
Phosphorus: 2.9 mg/dL (ref 2.5–4.6)
Potassium: 3.8 mmol/L (ref 3.5–5.1)
Sodium: 133 mmol/L — ABNORMAL LOW (ref 135–145)

## 2021-11-04 LAB — MAGNESIUM: Magnesium: 1.8 mg/dL (ref 1.7–2.4)

## 2021-11-04 NOTE — Progress Notes (Signed)
Patient ID: Stephen Cobb, male   DOB: [birth year], 87 y.o.   MRN: [id number] Pt's f/u CXR today (on tube waterseal) shows no sig change of hydroptx; images were reviewed by Dr. Archer Asa; decision made to remove right chest tube; tube removed at bedside in its entirety without immediate complications. Vaseline/gauze dressing applied to site. Pt did have some drainage of yellow pleural fluid afterwards. Nurse notified.

## 2021-11-05 ENCOUNTER — Other Ambulatory Visit (HOSPITAL_COMMUNITY): Payer: Medicare Other

## 2021-11-06 ENCOUNTER — Other Ambulatory Visit (HOSPITAL_COMMUNITY): Payer: Medicare Other

## 2021-11-06 LAB — RENAL FUNCTION PANEL
Albumin: 1.6 g/dL — ABNORMAL LOW (ref 3.5–5.0)
Anion gap: 7 (ref 5–15)
BUN: 42 mg/dL — ABNORMAL HIGH (ref 8–23)
CO2: 18 mmol/L — ABNORMAL LOW (ref 22–32)
Calcium: 7.8 mg/dL — ABNORMAL LOW (ref 8.9–10.3)
Chloride: 110 mmol/L (ref 98–111)
Creatinine, Ser: 1.33 mg/dL — ABNORMAL HIGH (ref 0.61–1.24)
GFR, Estimated: 51 mL/min — ABNORMAL LOW (ref >60)
Glucose, Bld: 153 mg/dL — ABNORMAL HIGH (ref 70–99)
Phosphorus: 2.8 mg/dL (ref 2.5–4.6)
Potassium: 3.5 mmol/L (ref 3.5–5.1)
Sodium: 135 mmol/L (ref 135–145)

## 2021-11-06 LAB — CBC
HCT: 31.2 % — ABNORMAL LOW (ref 39.0–52.0)
Hemoglobin: 9.4 g/dL — ABNORMAL LOW (ref 13.0–17.0)
MCH: 25.4 pg — ABNORMAL LOW (ref 26.0–34.0)
MCHC: 30.1 g/dL (ref 30.0–36.0)
MCV: 84.3 fL (ref 80.0–100.0)
Platelets: 176 10*3/uL (ref 150–400)
RBC: 3.7 MIL/uL — ABNORMAL LOW (ref 4.22–5.81)
RDW: 18.9 % — ABNORMAL HIGH (ref 11.5–15.5)
WBC: 7.7 10*3/uL (ref 4.0–10.5)
nRBC: 0 % (ref 0.0–0.2)

## 2021-11-06 LAB — MAGNESIUM: Magnesium: 1.6 mg/dL — ABNORMAL LOW (ref 1.7–2.4)

## 2021-11-07 LAB — MAGNESIUM: Magnesium: 1.8 mg/dL (ref 1.7–2.4)

## 2021-11-07 LAB — RENAL FUNCTION PANEL
Albumin: 1.7 g/dL — ABNORMAL LOW (ref 3.5–5.0)
Anion gap: 6 (ref 5–15)
BUN: 37 mg/dL — ABNORMAL HIGH (ref 8–23)
CO2: 19 mmol/L — ABNORMAL LOW (ref 22–32)
Calcium: 7.8 mg/dL — ABNORMAL LOW (ref 8.9–10.3)
Chloride: 109 mmol/L (ref 98–111)
Creatinine, Ser: 1.36 mg/dL — ABNORMAL HIGH (ref 0.61–1.24)
GFR, Estimated: 50 mL/min — ABNORMAL LOW (ref >60)
Glucose, Bld: 168 mg/dL — ABNORMAL HIGH (ref 70–99)
Phosphorus: 2.8 mg/dL (ref 2.5–4.6)
Potassium: 3.4 mmol/L — ABNORMAL LOW (ref 3.5–5.1)
Sodium: 134 mmol/L — ABNORMAL LOW (ref 135–145)

## 2021-11-08 ENCOUNTER — Other Ambulatory Visit (HOSPITAL_COMMUNITY): Payer: Medicare Other

## 2021-11-08 LAB — CBC
HCT: 29.6 % — ABNORMAL LOW (ref 39.0–52.0)
Hemoglobin: 9 g/dL — ABNORMAL LOW (ref 13.0–17.0)
MCH: 25.3 pg — ABNORMAL LOW (ref 26.0–34.0)
MCHC: 30.4 g/dL (ref 30.0–36.0)
MCV: 83.1 fL (ref 80.0–100.0)
Platelets: 203 10*3/uL (ref 150–400)
RBC: 3.56 MIL/uL — ABNORMAL LOW (ref 4.22–5.81)
RDW: 19.1 % — ABNORMAL HIGH (ref 11.5–15.5)
WBC: 6.9 10*3/uL (ref 4.0–10.5)
nRBC: 0 % (ref 0.0–0.2)

## 2021-11-08 LAB — RENAL FUNCTION PANEL
Albumin: 1.7 g/dL — ABNORMAL LOW (ref 3.5–5.0)
Anion gap: 7 (ref 5–15)
BUN: 33 mg/dL — ABNORMAL HIGH (ref 8–23)
CO2: 21 mmol/L — ABNORMAL LOW (ref 22–32)
Calcium: 8 mg/dL — ABNORMAL LOW (ref 8.9–10.3)
Chloride: 107 mmol/L (ref 98–111)
Creatinine, Ser: 1.33 mg/dL — ABNORMAL HIGH (ref 0.61–1.24)
GFR, Estimated: 51 mL/min — ABNORMAL LOW (ref >60)
Glucose, Bld: 181 mg/dL — ABNORMAL HIGH (ref 70–99)
Phosphorus: 2.5 mg/dL (ref 2.5–4.6)
Potassium: 4 mmol/L (ref 3.5–5.1)
Sodium: 135 mmol/L (ref 135–145)

## 2021-11-08 LAB — MAGNESIUM: Magnesium: 1.8 mg/dL (ref 1.7–2.4)

## 2021-11-09 ENCOUNTER — Other Ambulatory Visit (HOSPITAL_COMMUNITY): Payer: Medicare Other

## 2021-11-09 MED ORDER — DIATRIZOATE MEGLUMINE & SODIUM 66-10 % PO SOLN
ORAL | Status: AC
  Administered 2021-11-09: 30 mL via GASTROSTOMY
  Filled 2021-11-09: qty 30

## 2021-11-09 NOTE — Procedures (Signed)
PROCEDURE SUMMARY:  Successful exchange of gastrostomy tube. The retention balloon was inflated with 8 cc NS, bumper cinched to the skin.   No complications.  EBL = none   PEG tube location confirmation xray ordered, result pending.   Please see full dictation in imaging section of Epic for procedure details.   Marybeth Dandy Rexene Edison Starlett Pehrson PA-C 11/09/2021 1:01 PM

## 2021-11-10 LAB — MISC LABCORP TEST (SEND OUT): Labcorp test code: 9985

## 2021-12-29 DEATH — deceased

## 2022-11-29 IMAGING — DX DG CHEST 1V PORT
1 series · 1 of 1 positions shown · non-contrast
Comparison: Chest x-ray 09/21/2021.

CLINICAL DATA: 89-year-old male with history of pneumonia.

EXAM:
PORTABLE CHEST 1 VIEW

[chest ap]
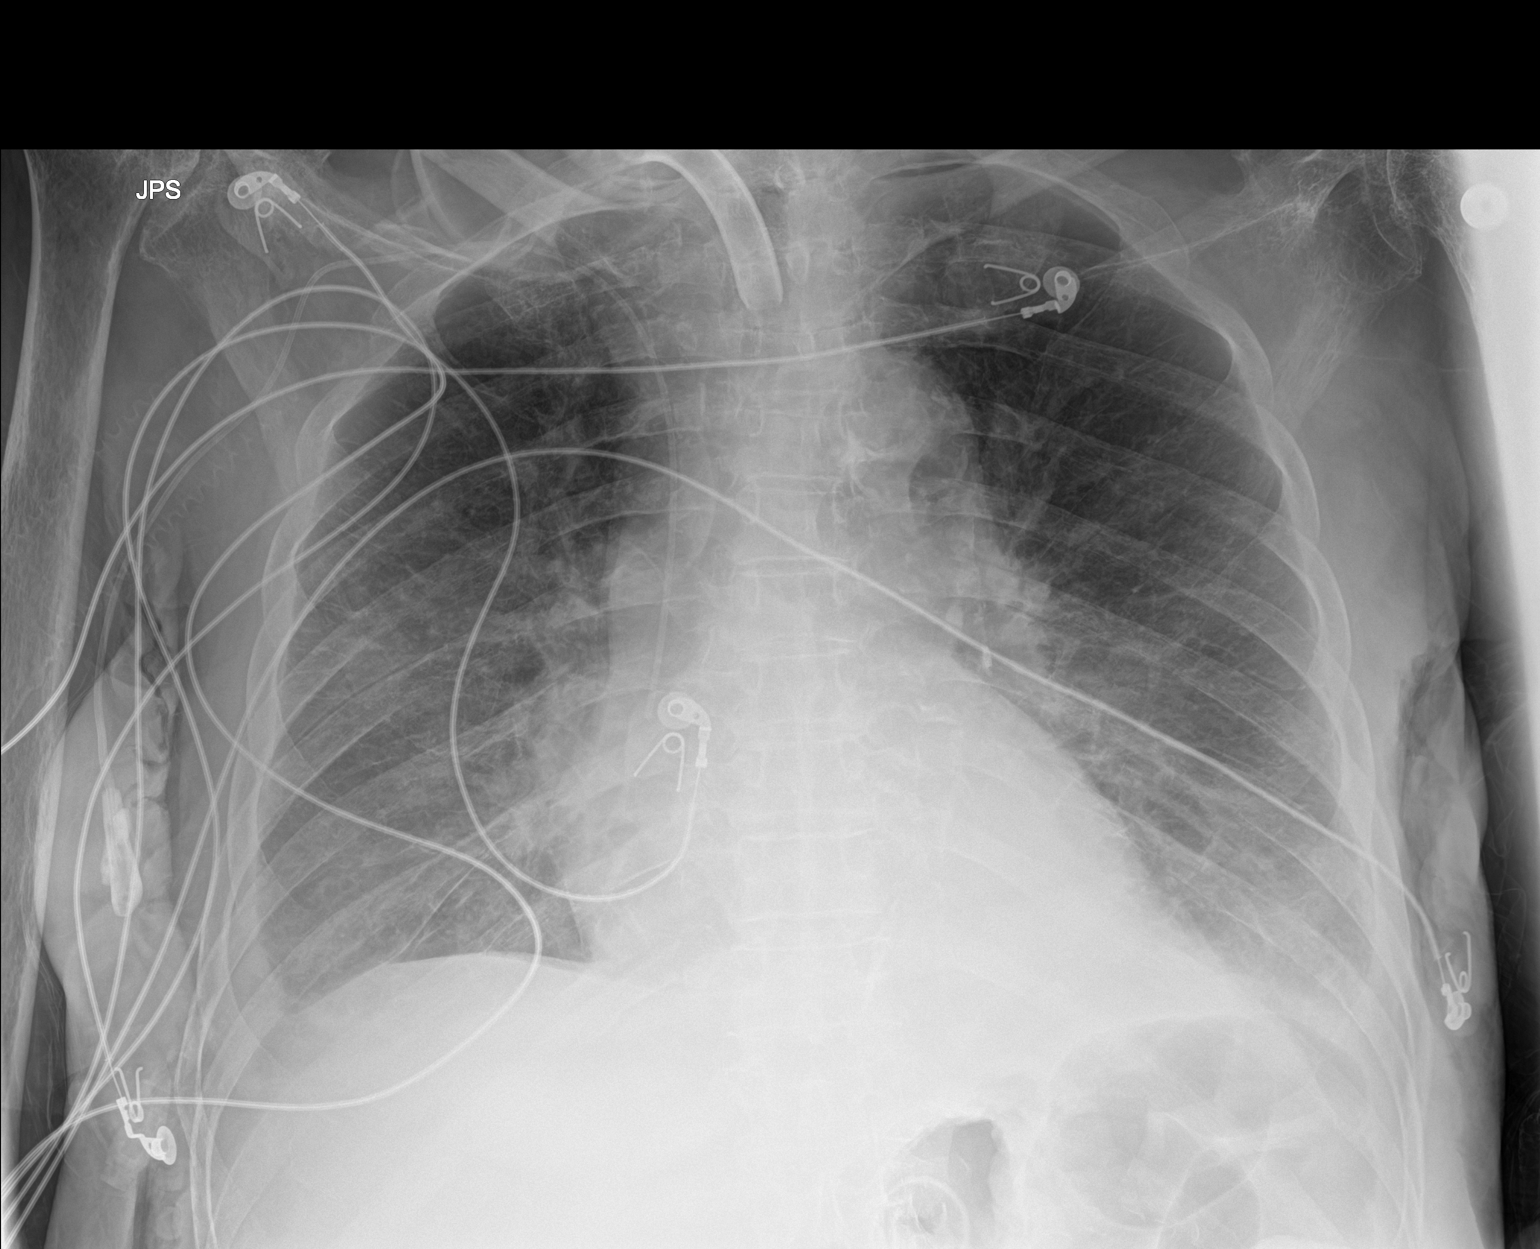

[1 of 1 positions shown; findings below may reference images not displayed]

FINDINGS: A tracheostomy tube is in place with tip 7.3 cm above the carina.
There is a right upper extremity PICC with tip terminating in the
superior cavoatrial junction. Opacity in the medial aspect of the
left lung base may reflect atelectasis and/or consolidation.
Widespread areas of interstitial prominence are also noted
throughout the mid to lower lungs bilaterally. Small bilateral
pleural effusions. No pneumothorax. No evidence of pulmonary edema.
Heart size is normal. The patient is rotated to the right on today's
exam, resulting in distortion of the mediastinal contours and
reduced diagnostic sensitivity and specificity for mediastinal
pathology. Atherosclerotic calcifications in the thoracic aorta.
IMPRESSION: 1. Support apparatus, as above.
2. Atelectasis and/or consolidation in the left lower lobe with
small bilateral pleural effusions. Patchy areas of interstitial
prominence throughout the mid to lower lungs bilaterally may reflect
additional areas of bronchopneumonia.

## 2022-12-01 IMAGING — DX DG CHEST 1V PORT
1 series · 1 of 1 positions shown · non-contrast
Comparison: 09/25/2019

CLINICAL DATA: Short of breath

EXAM:
PORTABLE CHEST 1 VIEW

[chest ap]
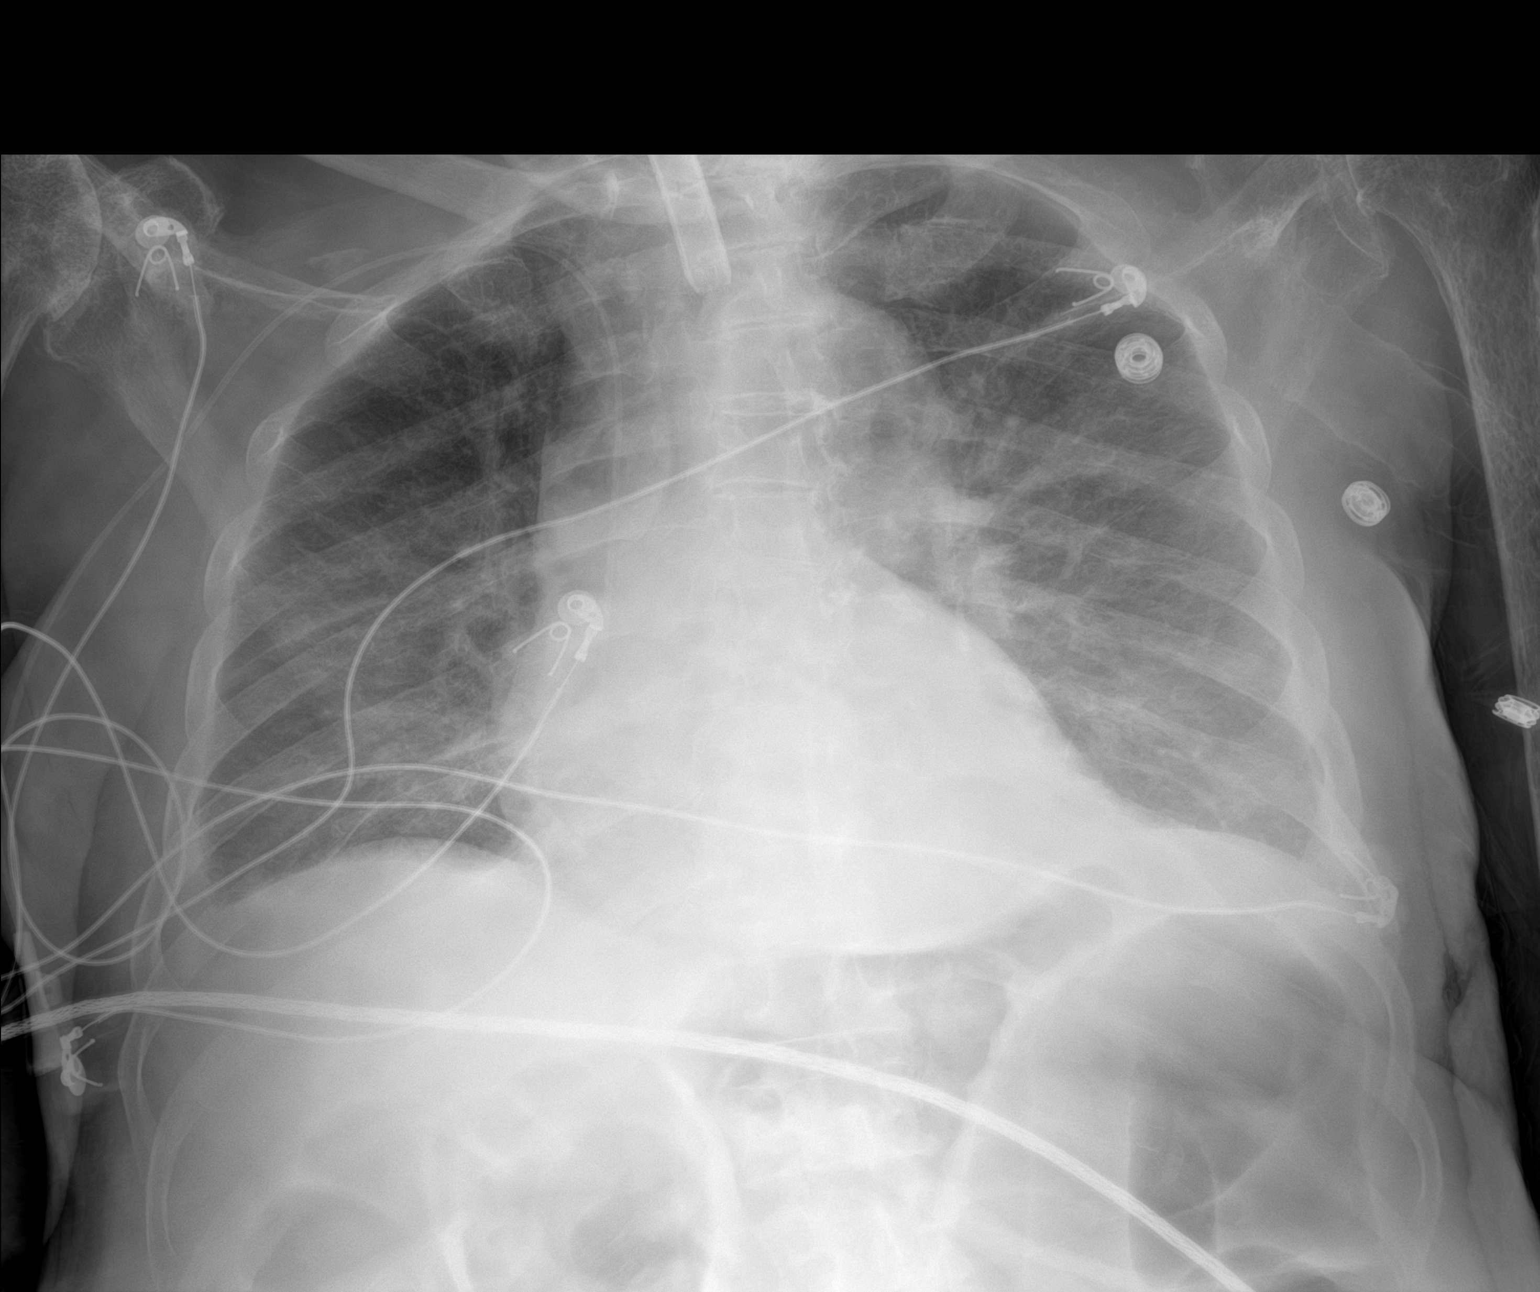

[1 of 1 positions shown; findings below may reference images not displayed]

FINDINGS: Tracheostomy tube and PICC line unchanged. Stable cardiac
silhouette. Low lung volumes. Fine airspace disease pattern. No
pneumothorax.
IMPRESSION: 1. Stable support apparatus.
2. Fine pulmonary edema pattern.

## 2022-12-02 IMAGING — DX DG ABD PORTABLE 1V
1 series · 1 of 1 positions shown · non-contrast
Comparison: 09/26/2021.

CLINICAL DATA: 89-year-old male with history of ileus.

EXAM:
PORTABLE ABDOMEN - 1 VIEW

[abdomen kub]
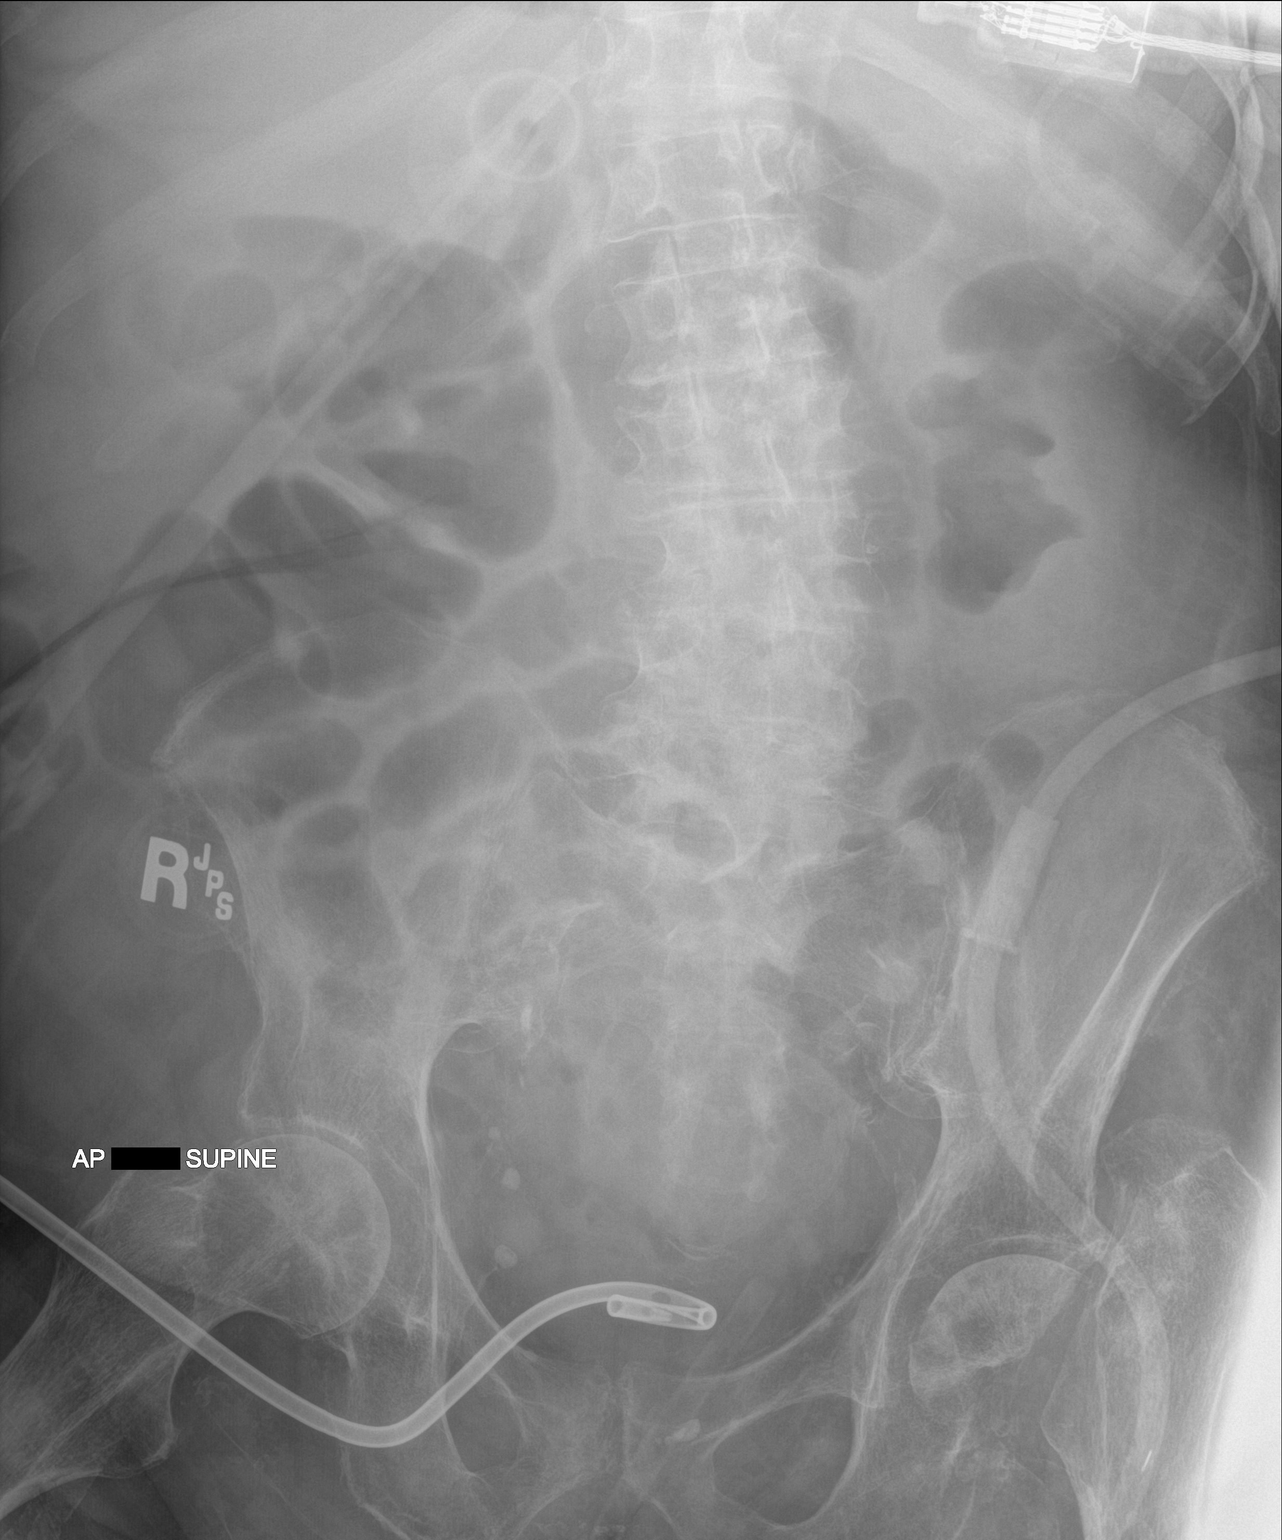

[1 of 1 positions shown; findings below may reference images not displayed]

FINDINGS: Small bore drainage catheter with tip formed over the low anatomic
pelvis. Several nondilated gas-filled loops of small bowel are noted
in the central abdomen. Gas and stool are noted throughout the
colon. Paucity of distal rectal gas. No definitive pneumoperitoneum
noted on this single supine image.
IMPRESSION: 1. Nonspecific, nonobstructive bowel gas pattern, as above.

## 2022-12-04 IMAGING — DX DG ABD PORTABLE 1V
1 series · 1 of 1 positions shown · non-contrast
Comparison: 09/27/2021.

CLINICAL DATA: 89-year-old male with history of ileus.

EXAM:
PORTABLE ABDOMEN - 1 VIEW

[abdomen]
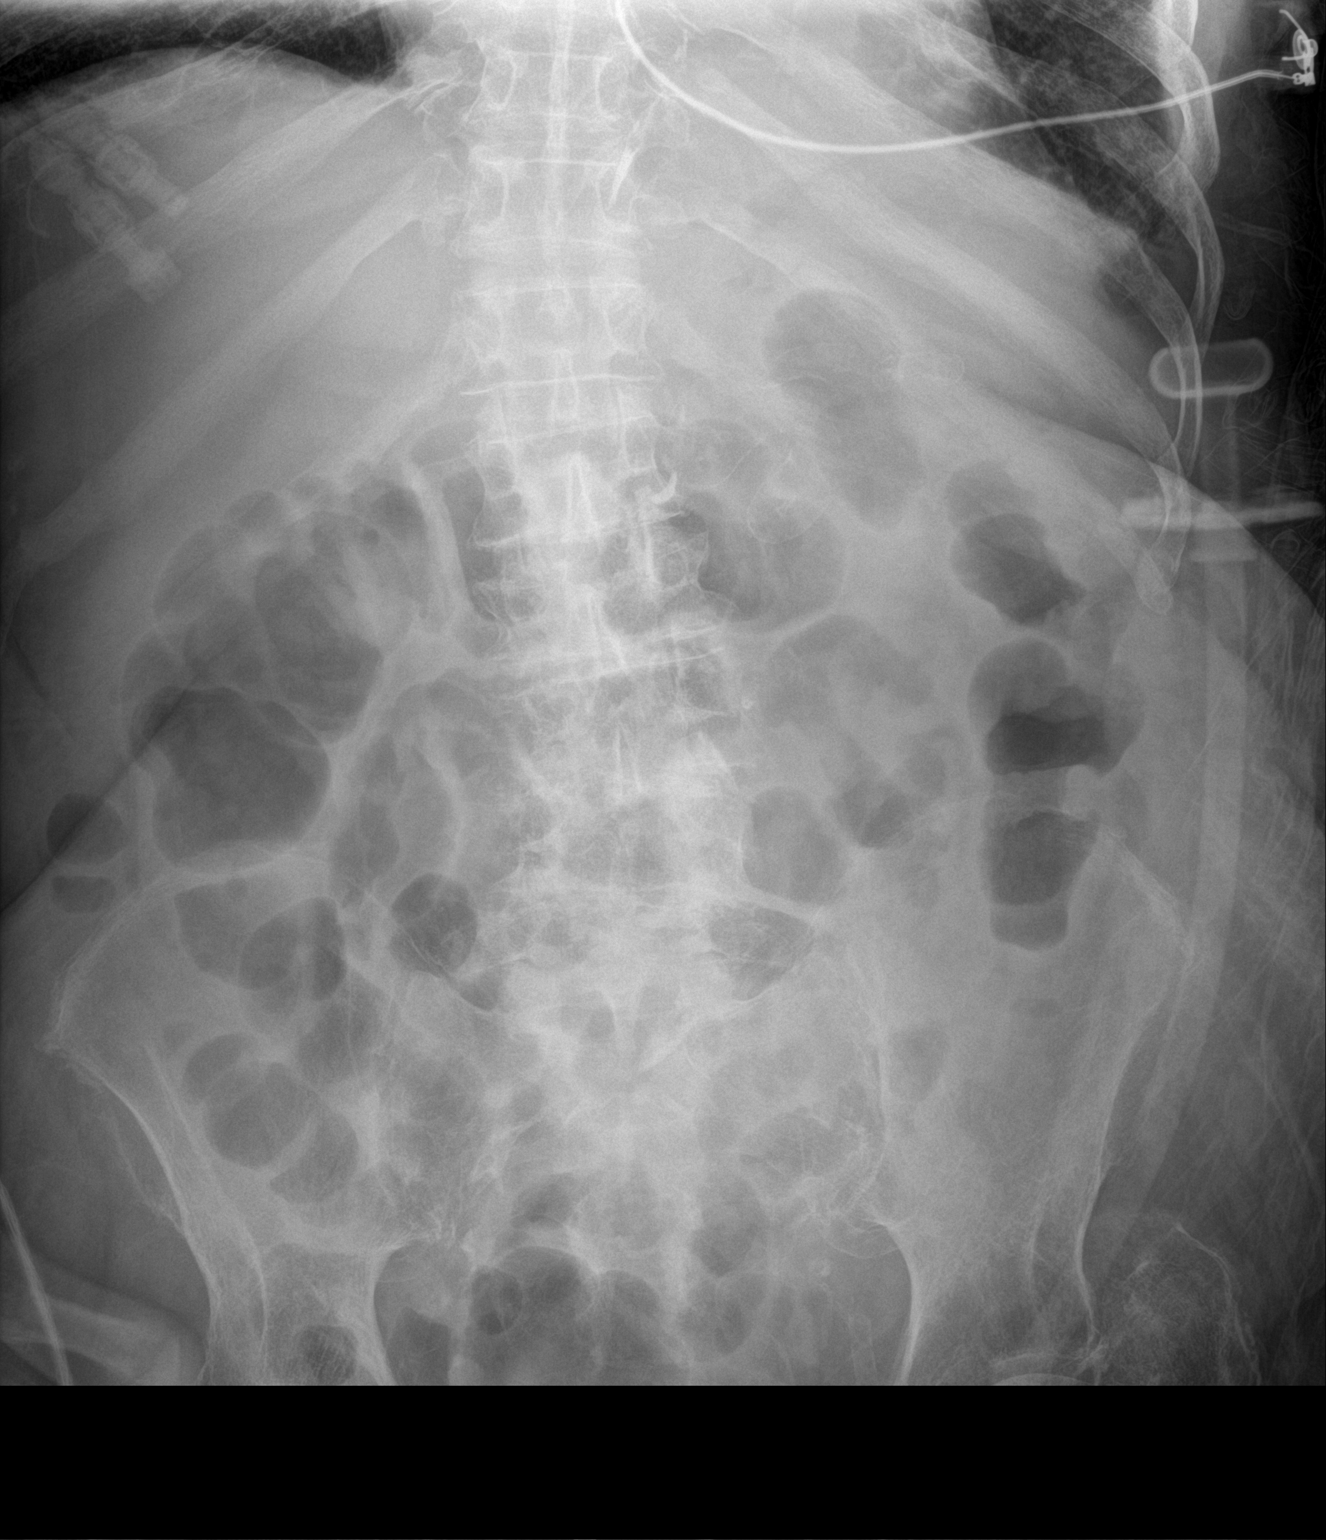

[1 of 1 positions shown; findings below may reference images not displayed]

FINDINGS: Several nondilated loops of gas-filled small bowel are noted in the
central abdomen. Gas and stool is noted throughout the colon and
distal rectum. No pathologic dilatation of small bowel or colon. No
gross evidence of pneumoperitoneum on this single supine image.
Small bore pigtail drainage catheter again projects over the low
anatomic pelvis.
IMPRESSION: 1. Nonspecific, nonobstructive bowel gas pattern, as above.
